# Patient Record
Sex: Male | Born: 1975 | Race: White | Hispanic: No | Smoking: Never smoker
Health system: Southern US, Community
[De-identification: ages and names within clinical notes are randomized; demographics above are authoritative.]

## PROBLEM LIST (undated history)

## (undated) DIAGNOSIS — K219 Gastro-esophageal reflux disease without esophagitis: Secondary | ICD-10-CM

## (undated) HISTORY — DX: Gastro-esophageal reflux disease without esophagitis: K21.9

---

## 2009-05-11 ENCOUNTER — Ambulatory Visit: Payer: Self-pay | Admitting: Family Medicine

## 2009-05-11 DIAGNOSIS — J309 Allergic rhinitis, unspecified: Secondary | ICD-10-CM | POA: Insufficient documentation

## 2009-05-11 DIAGNOSIS — F528 Other sexual dysfunction not due to a substance or known physiological condition: Secondary | ICD-10-CM | POA: Insufficient documentation

## 2009-11-09 ENCOUNTER — Ambulatory Visit: Payer: Self-pay | Admitting: Family Medicine

## 2009-11-09 DIAGNOSIS — L723 Sebaceous cyst: Secondary | ICD-10-CM | POA: Insufficient documentation

## 2009-11-16 ENCOUNTER — Ambulatory Visit: Payer: Self-pay | Admitting: Family Medicine

## 2009-11-16 DIAGNOSIS — R5383 Other fatigue: Secondary | ICD-10-CM | POA: Insufficient documentation

## 2009-11-16 DIAGNOSIS — R5381 Other malaise: Secondary | ICD-10-CM | POA: Insufficient documentation

## 2009-11-16 LAB — CONVERTED CEMR LAB
ALT: 27 units/L (ref 0–53)
AST: 34 units/L (ref 0–37)
Albumin: 3.7 g/dL (ref 3.5–5.2)
Alkaline Phosphatase: 63 units/L (ref 39–117)
Bilirubin, Direct: 0.2 mg/dL (ref 0.0–0.3)
Cholesterol: 166 mg/dL (ref 0–200)
HDL: 55.4 mg/dL (ref >39.00)
LDL Cholesterol: 98 mg/dL (ref 0–99)
Sex Hormone Binding: 27 nmol/L (ref 13–71)
Testosterone Free: 157.2 pg/mL (ref 47.0–244.0)
Testosterone-% Free: 2.4 % (ref 1.6–2.9)
Testosterone: 643.57 ng/dL (ref 350–890)
Total Bilirubin: 0.9 mg/dL (ref 0.3–1.2)
Total CHOL/HDL Ratio: 3
Total Protein: 6.5 g/dL (ref 6.0–8.3)
Triglycerides: 62 mg/dL (ref 0.0–149.0)
VLDL: 12.4 mg/dL (ref 0.0–40.0)

## 2009-11-21 LAB — CONVERTED CEMR LAB
BUN: 26 mg/dL — ABNORMAL HIGH (ref 6–23)
CO2: 24 meq/L (ref 19–32)
Calcium: 9 mg/dL (ref 8.4–10.5)
Chloride: 107 meq/L (ref 96–112)
Creatinine, Ser: 1.3 mg/dL (ref 0.4–1.5)
GFR calc non Af Amer: 67.93 mL/min (ref >60)
Glucose, Bld: 73 mg/dL (ref 70–99)
Potassium: 4.2 meq/L (ref 3.5–5.1)
Sodium: 141 meq/L (ref 135–145)

## 2010-06-21 ENCOUNTER — Telehealth: Payer: Self-pay | Admitting: Family Medicine

## 2010-10-25 ENCOUNTER — Ambulatory Visit: Payer: Self-pay | Admitting: Family Medicine

## 2010-10-25 DIAGNOSIS — N529 Male erectile dysfunction, unspecified: Secondary | ICD-10-CM | POA: Insufficient documentation

## 2010-10-25 LAB — CONVERTED CEMR LAB
Bacteria, UA: NONE SEEN
Casts: NONE SEEN /lpf
Crystals: NONE SEEN
Prolactin: 3.9 ng/mL (ref 2.1–17.1)
Sex Hormone Binding: 29 nmol/L (ref 13–71)
Squamous Epithelial / LPF: NONE SEEN /lpf
Testosterone Free: 60.1 pg/mL (ref 47.0–244.0)
Testosterone-% Free: 2.1 % (ref 1.6–2.9)
Testosterone: 285.26 ng/dL (ref 250–890)

## 2010-10-30 ENCOUNTER — Encounter (INDEPENDENT_AMBULATORY_CARE_PROVIDER_SITE_OTHER): Payer: Self-pay | Admitting: *Deleted

## 2010-10-30 LAB — CONVERTED CEMR LAB
BUN: 27 mg/dL — ABNORMAL HIGH (ref 6–23)
Basophils Absolute: 0 10*3/uL (ref 0.0–0.1)
Basophils Relative: 0.5 % (ref 0.0–3.0)
CO2: 32 meq/L (ref 19–32)
Calcium: 9.5 mg/dL (ref 8.4–10.5)
Chloride: 104 meq/L (ref 96–112)
Creatinine, Ser: 1.4 mg/dL (ref 0.4–1.5)
Eosinophils Absolute: 0.3 10*3/uL (ref 0.0–0.7)
Eosinophils Relative: 4.1 % (ref 0.0–5.0)
FSH: 3.7 milliintl units/mL (ref 1.4–18.1)
GFR calc non Af Amer: 63.57 mL/min (ref >60)
Glucose, Bld: 88 mg/dL (ref 70–99)
HCT: 45.6 % (ref 39.0–52.0)
Hemoglobin: 15.8 g/dL (ref 13.0–17.0)
LH: 2 milliintl units/mL (ref 1.50–9.30)
Lymphocytes Relative: 26.9 % (ref 12.0–46.0)
Lymphs Abs: 2.1 10*3/uL (ref 0.7–4.0)
MCHC: 34.7 g/dL (ref 30.0–36.0)
MCV: 89.7 fL (ref 78.0–100.0)
Monocytes Absolute: 0.6 10*3/uL (ref 0.1–1.0)
Monocytes Relative: 8.2 % (ref 3.0–12.0)
Neutro Abs: 4.7 10*3/uL (ref 1.4–7.7)
Neutrophils Relative %: 60.3 % (ref 43.0–77.0)
Platelets: 175 10*3/uL (ref 150.0–400.0)
Potassium: 4 meq/L (ref 3.5–5.1)
RBC: 5.08 M/uL (ref 4.22–5.81)
RDW: 13.4 % (ref 11.5–14.6)
Sodium: 141 meq/L (ref 135–145)
TSH: 1.12 microintl units/mL (ref 0.35–5.50)
WBC: 7.8 10*3/uL (ref 4.5–10.5)

## 2011-01-23 NOTE — Progress Notes (Signed)
Summary: mid chest pain  Phone Note Call from Patient   Caller: Patient Call For: Hannah Beat MD Summary of Call: Pt walked in complaining of mid chest pain since monday.  He says it's not all the time but if he turns a certain way he feels a sharp pain.  He works out at [employer] but didnt do anything, he thought, that would have pulled a muscle.  I told him he could have done something and not be aware of it.  No cardiac sxs.  Offered appt tomorrow but he said he would wait and see how he feels. Initial call taken by: Lowella Petties CMA,  June 21, 2010 9:15 AM  Follow-up for Phone Call        reasonable Follow-up by: Hannah Beat MD,  June 21, 2010 9:18 AM

## 2011-01-23 NOTE — Miscellaneous (Signed)
  Clinical Lists Changes  Medications: Added new medication of CIALIS 20 MG TABS (TADALAFIL) take 1/2 to 1 tablet 30 minute prior to intercourse - Signed Rx of CIALIS 20 MG TABS (TADALAFIL) take 1/2 to 1 tablet 30 minute prior to intercourse;  #10 x 11;  Signed;  Entered by: Benny Lennert CMA (AAMA);  Authorized by: Hannah Beat MD;  Method used: Electronically to [facility] [address], [place], [place], [place]  [address], Ph: [phone], Fax: [phone]    Prescriptions: CIALIS 20 MG TABS (TADALAFIL) take 1/2 to 1 tablet 30 minute prior to intercourse  #10 x 11   Entered by:   Benny Lennert CMA (AAMA)   Authorized by:   Hannah Beat MD   Signed by:   Benny Lennert CMA (AAMA) on 10/30/2010   Method used:   Electronically to        [facility] [address] (retail)       [address], [address]       [place], [place]  [address]       Ph: [phone]       Fax: [phone]   RxID:   [id number]   Prior Medications: ZYRTEC ALLERGY 10 MG TABS (CETIRIZINE HCL) once a day as needed for allergies Current Allergies: No known allergies

## 2011-01-23 NOTE — Assessment & Plan Note (Signed)
Summary: NEW PT TO EST/CLE   Vital Signs:  Patient profile:   35 year old male Height:      70 inches Weight:      172.0 pounds BMI:     24.77 Temp:     98.0 degrees F oral Pulse rate:   72 / minute Pulse rhythm:   regular BP sitting:   110 / 70  (left arm) Cuff size:   regular  Vitals Entered By: Benny Lennert CMA (May 11, 2009 9:16 AM)  History of Present Illness: Chief complaint new patient to be established   51 year old:  He and wife is having some tough times.  Quit drinking six months.   his primary concern today is discussion regarding problems with sexual function with his wife. At this point, he has been unable to achieve sexual intercourse over the last several months with his wife. They had serious marital problems within the last 6-9 months, but these have reportedly gotten better. They have seen a couple Veterinary surgeon. He has been able to achieve an erection in the morning. He also has been able to achieve an erection at other times, and he is able to masturbate orgasm. He has had previous times masturbated with significant frequency. There has been no infidelity, or concern for sexual transmitted infections.  Protein, creatine, aa, no anabolics. Never done cycles, GH.  SEX COUNSELOR  Preventive Screening-Counseling & Management     Smoking Status: never     Does Patient Exercise: yes      Drug Use:  no.    Allergies (verified): No Known Drug Allergies  Past History:  Past Medical History:    Allergic rhinitis  Past Surgical History:    no  Family History:    Family History of Alcoholism/Addiction  Social History:    Marital Status: Married    Children:     Occupation: works at [employer]    Former Heavy ETOH, now rare    Never Smoked    Drug use-no    Regular exercise-yes    Likes to work out a lot.    Smoking Status:  never    Drug Use:  no    Does Patient Exercise:  yes  Review of Systems General:  Denies chills, fatigue, and  fever. GU:  Complains of decreased libido and erectile dysfunction; denies discharge, dysuria, genital sores, hematuria, incontinence, nocturia, urinary frequency, and urinary hesitancy.  Physical Exam  General:  GEN: Well-developed,well-nourished,in no acute distress; alert,appropriate and cooperative throughout examination HEENT: Normocephalic and atraumatic without obvious abnormalities. No apparent alopecia or balding. Ears, externally no deformities PULM: Breathing comfortably in no respiratory distress EXT: No clubbing, cyanosis, or edema PSYCH: Normally interactive. Cooperative during the interview. Pleasant. Friendly and conversant. Not anxious or depressed appearing. Normal, full affect.    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 I recommended primarily, and that he and his wife seek sexual counseling, and made this referral. It does not sound me like his history is consistent with organic impotence.  Orders: Psychology Referral (Psychology)  Complete Medication List: 1)  Zyrtec Allergy 10 Mg Tabs (Cetirizine hcl) .Marland KitchenMarland KitchenMarland Kitchen  Once a day as needed for allergies    Current Allergies (reviewed today): No known allergies

## 2011-01-23 NOTE — Assessment & Plan Note (Signed)
Summary: HAS QUESTIONS / LFW   Vital Signs:  Patient profile:   35 year old male Height:      70 inches Weight:      188 pounds BMI:     27.07 Temp:     99.2 degrees F oral Pulse rate:   76 / minute Pulse rhythm:   regular BP sitting:   110 / 70  (left arm) Cuff size:   regular  Vitals Entered By: Linde Gillis CMA Duncan Dull) (October 25, 2010 12:37 PM) CC: discuss things with Dr. Patsy Lager   History of Present Illness: 35 year old male:  Worried about erection issues.  Has not been able to have sex for a year.     Allergies (verified): No Known Drug Allergies  Physical Exam  General:  Well-developed,well-nourished,in no acute distress; alert,appropriate and cooperative throughout examination Head:  Normocephalic and atraumatic without obvious abnormalities. No apparent alopecia or balding. Ears:  no external deformities.   Nose:  no external deformity.   Abdomen:  Bowel sounds positive,abdomen soft and non-tender without masses, organomegaly or hernias noted. Genitalia:  Testes bilaterally descended without nodularity, tenderness or masses. No scrotal masses or lesions. No penis lesions or urethral discharge.   Impression & Recommendations:  Problem # 1:  ORGANIC IMPOTENCE (ICD-607.84) >25 minutes spent in face to face time with patient, >50% spent in counselling or coordination of care: complex conversation with the patient. 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 The patient did significant and walk cycle the summer, proximally 4 months ago. Also to continue 3 at that time. Currently he is taking a product called "manimal" and some nitric oxide boosters. He has concerns that either from these things or some other organic causes the point of his erectile difficulties. He has not had sexual intercourse with his wife for many months. These are all very valid concerns, he could have had a feedback mechanism into his hypothalamic pituitary axis. I will check for  this functionality. I think most recently, a trial of some Viagra or Cialis after this workup is most appropriate  Orders: Venipuncture (24401) TLB-BMP (Basic Metabolic Panel-BMET) (80048-METABOL) TLB-TSH (Thyroid Stimulating Hormone) (84443-TSH) T- * Misc. Laboratory test [phone]) TLB-FSH (Follicle Stimulating Hormone) (83001-FSH) TLB-Luteinizing Hormone [facility]) (83002-LH) T-Prolactin [phone]) TLB-CBC Platelet - w/Differential (85025-CBCD) T-Urine Microscopic ([id number])  Problem # 2:  FATIGUE (ICD-780.79)  Orders: Venipuncture (43329) TLB-BMP (Basic Metabolic Panel-BMET) (80048-METABOL) TLB-TSH (Thyroid Stimulating Hormone) (84443-TSH) T- * Misc. Laboratory test [phone]) TLB-FSH (Follicle Stimulating Hormone) (83001-FSH) TLB-Luteinizing Hormone (LH) (83002-LH) T-Prolactin [phone]) TLB-CBC Platelet - w/Differential (85025-CBCD) T-Urine Microscopic (10932-35573)  Complete Medication List: 1)  Zyrtec Allergy 10 Mg Tabs (Cetirizine hcl) .... Once a day as needed for allergies   Orders Added: 1)  Venipuncture [36415] 2)  TLB-BMP (Basic Metabolic Panel-BMET) [80048-METABOL] 3)  TLB-TSH (Thyroid Stimulating Hormone) [84443-TSH] 4)  T- * Misc. Laboratory test [99999] 5)  TLB-FSH (Follicle Stimulating Hormone) [83001-FSH] 6)  TLB-Luteinizing Hormone (LH) [83002-LH] 7)  T-Prolactin [22025-42706] 8)  TLB-CBC Platelet - w/Differential [85025-CBCD] 9)  T-Urine Microscopic [23762-83151] 10)  Est. Patient Level IV [76160]    Current Allergies (reviewed today): No known allergies

## 2011-11-25 ENCOUNTER — Other Ambulatory Visit: Payer: Self-pay | Admitting: Family Medicine

## 2013-01-16 ENCOUNTER — Ambulatory Visit (INDEPENDENT_AMBULATORY_CARE_PROVIDER_SITE_OTHER): Payer: BC Managed Care – PPO | Admitting: Internal Medicine

## 2013-01-16 ENCOUNTER — Encounter: Payer: Self-pay | Admitting: Internal Medicine

## 2013-01-16 ENCOUNTER — Telehealth: Payer: Self-pay | Admitting: Family Medicine

## 2013-01-16 VITALS — BP 122/80 | HR 96 | Temp 98.6°F | Wt 197.0 lb

## 2013-01-16 DIAGNOSIS — K219 Gastro-esophageal reflux disease without esophagitis: Secondary | ICD-10-CM

## 2013-01-16 DIAGNOSIS — K1379 Other lesions of oral mucosa: Secondary | ICD-10-CM

## 2013-01-16 DIAGNOSIS — K1370 Unspecified lesions of oral mucosa: Secondary | ICD-10-CM

## 2013-01-16 MED ORDER — PREDNISONE 20 MG PO TABS: 40.0000 mg | ORAL_TABLET | Freq: Every day | ORAL | Status: DC

## 2013-01-16 NOTE — Telephone Encounter (Signed)
Patient Information:  Caller Name: Rip  Phone: [phone]  Patient: Howard Weeks, Howard Weeks  Gender: Male  DOB: 19-Sep-1976  Age: 37 Years  PCP: Hannah Beat (Family Practice)  Office Follow Up:  Does the office need to follow up with this patient?: No  Instructions For The Office: N/A   Symptoms  Reason For Call & Symptoms: Patient states she feels like something in his throat.  Reviewed Health History In EMR: Yes  Reviewed Medications In EMR: Yes  Reviewed Allergies In EMR: Yes  Reviewed Surgeries / Procedures: Yes  Date of Onset of Symptoms: 01/16/2013  Guideline(s) Used:  Sore Throat  Disposition Per Guideline:   See Today in Office  Reason For Disposition Reached:   Severe sore throat pain  Advice Given:  N/A  Appointment Scheduled:  01/16/2013 16:00:00 Appointment Scheduled Provider:  Tillman Abide [facility])

## 2013-01-16 NOTE — Patient Instructions (Signed)
Please start over the counter omeprazole 20mg  daily on an empty stomach. You can decrease this to every other day after 2-4 weeks if your heartburn is quiet. You can further decrease it or use it only as needed if things really quiet down.   Gastroesophageal Reflux Disease, Adult Gastroesophageal reflux disease (GERD) happens when acid from your stomach flows up into the esophagus. When acid comes in contact with the esophagus, the acid causes soreness (inflammation) in the esophagus. Over time, GERD may create small holes (ulcers) in the lining of the esophagus. CAUSES   Increased body weight. This puts pressure on the stomach, making acid rise from the stomach into the esophagus.  Smoking. This increases acid production in the stomach.  Drinking alcohol. This causes decreased pressure in the lower esophageal sphincter (valve or ring of muscle between the esophagus and stomach), allowing acid from the stomach into the esophagus.  Late evening meals and a full stomach. This increases pressure and acid production in the stomach.  A malformed lower esophageal sphincter. Sometimes, no cause is found. SYMPTOMS   Burning pain in the lower part of the mid-chest behind the breastbone and in the mid-stomach area. This may occur twice a week or more often.  Trouble swallowing.  Sore throat.  Dry cough.  Asthma-like symptoms including chest tightness, shortness of breath, or wheezing. DIAGNOSIS  Your caregiver may be able to diagnose GERD based on your symptoms. In some cases, X-rays and other tests may be done to check for complications or to check the condition of your stomach and esophagus. TREATMENT  Your caregiver may recommend over-the-counter or prescription medicines to help decrease acid production. Ask your caregiver before starting or adding any new medicines.  HOME CARE INSTRUCTIONS   Change the factors that you can control. Ask your caregiver for guidance concerning weight loss,  quitting smoking, and alcohol consumption.  Avoid foods and drinks that make your symptoms worse, such as:  Caffeine or alcoholic drinks.  Chocolate.  Peppermint or mint flavorings.  Garlic and onions.  Spicy foods.  Citrus fruits, such as oranges, lemons, or limes.  Tomato-based foods such as sauce, chili, salsa, and pizza.  Fried and fatty foods.  Avoid lying down for the 3 hours prior to your bedtime or prior to taking a nap.  Eat small, frequent meals instead of large meals.  Wear loose-fitting clothing. Do not wear anything tight around your waist that causes pressure on your stomach.  Raise the head of your bed 6 to 8 inches with wood blocks to help you sleep. Extra pillows will not help.  Only take over-the-counter or prescription medicines for pain, discomfort, or fever as directed by your caregiver.  Do not take aspirin, ibuprofen, or other nonsteroidal anti-inflammatory drugs (NSAIDs). SEEK IMMEDIATE MEDICAL CARE IF:   You have pain in your arms, neck, jaw, teeth, or back.  Your pain increases or changes in intensity or duration.  You develop nausea, vomiting, or sweating (diaphoresis).  You develop shortness of breath, or you faint.  Your vomit is green, yellow, black, or looks like coffee grounds or blood.  Your stool is red, bloody, or black. These symptoms could be signs of other problems, such as heart disease, gastric bleeding, or esophageal bleeding. MAKE SURE YOU:   Understand these instructions.  Will watch your condition.  Will get help right away if you are not doing well or get worse. Document Released: 09/19/2005 Document Revised: 03/03/2012 Document Reviewed: 06/29/2011 [facility] Patient Information 2013 [employer].  

## 2013-01-16 NOTE — Assessment & Plan Note (Signed)
Doesn't appear to be infectious I suspect from acid reflux  Will treat with prednisone to reduce the swelling Recommended omeprazole regularly for a while to settle down apparent reflux Decrease caffeine Don't eat to bedtime Usually 2 drinks per day--will watch

## 2013-01-16 NOTE — Assessment & Plan Note (Signed)
Has clear and increasing symptoms Instructions given

## 2013-01-16 NOTE — Telephone Encounter (Signed)
See OV note.  

## 2013-01-16 NOTE — Progress Notes (Signed)
  Subjective:    Patient ID: Howard Weeks, male    DOB: 1976-07-20, 37 y.o.   MRN: [id number]  HPI Awoke this morning and feels like there is something partially blocking his throat Able to swallow Has some trouble talking  Non smoker Not sick No fever  Feels distinct swelling Can actually move it side to side (top of back of throat)  No current outpatient prescriptions on file prior to visit.    No Known Allergies  No past medical history on file.  No past surgical history on file.  No family history on file.  History   Social History  . Marital Status: Divorced    Spouse Name: N/A    Number of Children: 0  . Years of Education: N/A   Occupational History  . [employer]    Social History Main Topics  . Smoking status: Never Smoker   . Smokeless tobacco: Never Used  . Alcohol Use: No  . Drug Use: No  . Sexually Active: Not on file   Other Topics Concern  . Not on file   Social History Narrative  . No narrative on file   Review of Systems Some heartburn---near daily recently No regular cough No allergies or PND     Objective:   Physical Exam  Constitutional: He appears well-developed and well-nourished. No distress.  HENT:  Nose: Nose normal.       Swollen red uvula --sits on top of tongue. Some injection of soft palate No sig tonsillar enlargement or injection   Neck: Normal range of motion.  Lymphadenopathy:    He has no cervical adenopathy.          Assessment & Plan:

## 2013-02-27 ENCOUNTER — Other Ambulatory Visit: Payer: Self-pay | Admitting: Family Medicine

## 2013-02-28 NOTE — Telephone Encounter (Signed)
OK to refill? You have not seen patient since 2011.

## 2013-03-01 NOTE — Telephone Encounter (Signed)
i refilled, but recommend f/u for CPX this spring or summer

## 2013-08-17 ENCOUNTER — Ambulatory Visit (INDEPENDENT_AMBULATORY_CARE_PROVIDER_SITE_OTHER): Payer: BC Managed Care – PPO | Admitting: Family Medicine

## 2013-08-17 ENCOUNTER — Encounter: Payer: Self-pay | Admitting: Family Medicine

## 2013-08-17 VITALS — BP 106/68 | HR 91 | Temp 98.7°F | Ht 70.0 in | Wt 190.2 lb

## 2013-08-17 DIAGNOSIS — M546 Pain in thoracic spine: Secondary | ICD-10-CM

## 2013-08-17 DIAGNOSIS — M549 Dorsalgia, unspecified: Secondary | ICD-10-CM | POA: Insufficient documentation

## 2013-08-17 MED ORDER — CYCLOBENZAPRINE HCL 10 MG PO TABS: 10.0000 mg | ORAL_TABLET | Freq: Every evening | ORAL | Status: DC | PRN

## 2013-08-17 NOTE — Assessment & Plan Note (Signed)
Suspect rhomboid /trap spasm  Recommend heat/ stretches/ massage adn ref to PT eval and tx  Flexeril given for nit time use nsaid for day  Adv against heavy lifting for now  Also cervical support pillow

## 2013-08-17 NOTE — Progress Notes (Signed)
Subjective:    Patient ID: Howard Weeks, male    DOB: 15-Nov-1976, 37 y.o.   MRN: [id number]  HPI Here with shoulder and elbow pain  Started 2 weeks ago after sleeping on a bad mattress (then worked out- but pain started before that) He thinks he may have a pinched nerve Pain is positional- twisting makes a difference and also turning head to L   Pain started medial to left shoulder pain  Then rad down to deltoid area of arm  Pain is not severe- more persistent and annoying (when not distracted)  Difficult to sleep now  Sleeps on 2 normal size pillows-nothing special   No numbness/ weakness or problems with grip  Thinks he has had this before from working out - much more brief   Massage eased it  Working out did not help it  Takes 600 to 800 mg of ibuprofen occ otc - helps some   Patient Active Problem List   Diagnosis Date Noted  . Uvular edema 01/16/2013  . GERD (gastroesophageal reflux disease)   . 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 Other malaise and fatigue 11/16/2009  . SEBACEOUS CYST 11/09/2009  . 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 ALLERGIC RHINITIS 05/11/2009   Past Medical History  Diagnosis Date  . GERD (gastroesophageal reflux disease)    No past surgical history on file. History  Substance Use Topics  . Smoking status: Never Smoker   . Smokeless tobacco: Never Used  . Alcohol Use: Yes     Comment: socially   No family history on file. No Known Allergies Current Outpatient Prescriptions on File Prior to Visit  Medication Sig Dispense Refill  . CIALIS 20 MG tablet TAKE ONE-HALF TO ONE TABLET 30 MINUTES PRIOR TO INTERCOURSE  10 tablet  5   No current facility-administered medications on file prior to visit.       Review of Systems Review of Systems  Constitutional: Negative for fever, appetite change, fatigue and unexpected weight change.  Eyes: Negative for pain and visual disturbance.  Respiratory: Negative for cough and shortness of breath.    Cardiovascular: Negative for cp or palpitations    Gastrointestinal: Negative for nausea, diarrhea and constipation.  Genitourinary: Negative for urgency and frequency.  Skin: Negative for pallor or rash  MSK neg for joint swelling or redness / neg for low back pain   Neurological: Negative for weakness, light-headedness, numbness and headaches.  Hematological: Negative for adenopathy. Does not bruise/bleed easily.  Psychiatric/Behavioral: Negative for dysphoric mood. The patient is not nervous/anxious.         Objective:   Physical Exam  Constitutional: He appears well-developed and well-nourished. No distress.  Well appearing muscular build   HENT:  Head: Normocephalic and atraumatic.  Mouth/Throat: Oropharynx is clear and moist.  Eyes: Conjunctivae and EOM are normal. Pupils are equal, round, and reactive to light. No scleral icterus.  Neck: Normal range of motion. Neck supple.  Nl rom neck  L rotation worsens his back pain   Cardiovascular: Normal rate and regular rhythm.   Pulmonary/Chest: Effort normal and breath sounds normal.  Musculoskeletal: Normal range of motion. He exhibits tenderness. He exhibits no edema.       Thoracic back: He exhibits tenderness and spasm. He exhibits normal range of motion, no bony tenderness, no swelling, no edema and no deformity.  Tender medial to L scapula with palpable spasm  No redness or joint changes  Nl rom shoulder and arm  Neg hawking/ neer tests  No arm tenderness  Lymphadenopathy:    He has no cervical adenopathy.  Neurological: He is alert. He has normal reflexes. He displays no atrophy and no tremor. No cranial nerve deficit or sensory deficit. He exhibits normal muscle tone. Coordination normal.  Skin: Skin is warm and dry. No rash noted. No erythema.  Psychiatric: He has a normal mood and affect.          Assessment & Plan:

## 2013-08-17 NOTE — Patient Instructions (Addendum)
Continue heat or ice or both and massage  Ibuprofen or aleve with food  Flexeril at night as needed Stop up front for Physical therapy referral

## 2014-02-26 ENCOUNTER — Telehealth: Payer: Self-pay

## 2014-02-26 ENCOUNTER — Emergency Department: Payer: Self-pay | Admitting: Emergency Medicine

## 2014-02-26 DIAGNOSIS — K219 Gastro-esophageal reflux disease without esophagitis: Secondary | ICD-10-CM

## 2014-02-26 LAB — COMPREHENSIVE METABOLIC PANEL
Albumin: 3.8 g/dL (ref 3.4–5.0)
Alkaline Phosphatase: 73 U/L
Anion Gap: 8 (ref 7–16)
BUN: 17 mg/dL (ref 7–18)
Bilirubin,Total: 0.3 mg/dL (ref 0.2–1.0)
Calcium, Total: 8.3 mg/dL — ABNORMAL LOW (ref 8.5–10.1)
Chloride: 106 mmol/L (ref 98–107)
Co2: 25 mmol/L (ref 21–32)
Creatinine: 1.18 mg/dL (ref 0.60–1.30)
EGFR (African American): >60
EGFR (Non-African Amer.): >60
Glucose: 121 mg/dL — ABNORMAL HIGH (ref 65–99)
Osmolality: 280 (ref 275–301)
Potassium: 3.5 mmol/L (ref 3.5–5.1)
SGOT(AST): 37 U/L (ref 15–37)
SGPT (ALT): 57 U/L (ref 12–78)
Sodium: 139 mmol/L (ref 136–145)
Total Protein: 7.7 g/dL (ref 6.4–8.2)

## 2014-02-26 LAB — URINALYSIS, COMPLETE
Bacteria: NONE SEEN
Bilirubin,UR: NEGATIVE
Blood: NEGATIVE
Glucose,UR: NEGATIVE mg/dL (ref 0–75)
Ketone: NEGATIVE
Leukocyte Esterase: NEGATIVE
Nitrite: NEGATIVE
Ph: 6 (ref 4.5–8.0)
Protein: NEGATIVE
RBC,UR: <1 /HPF (ref 0–5)
Specific Gravity: 1.02 (ref 1.003–1.030)
Squamous Epithelial: NONE SEEN
WBC UR: 1 /HPF (ref 0–5)

## 2014-02-26 LAB — CBC WITH DIFFERENTIAL/PLATELET
Basophil #: 0.1 10*3/uL (ref 0.0–0.1)
Basophil %: 0.6 %
Eosinophil #: 0.5 10*3/uL (ref 0.0–0.7)
Eosinophil %: 5.2 %
HCT: 47.2 % (ref 40.0–52.0)
HGB: 16 g/dL (ref 13.0–18.0)
Lymphocyte #: 2.6 10*3/uL (ref 1.0–3.6)
Lymphocyte %: 29.6 %
MCH: 32 pg (ref 26.0–34.0)
MCHC: 34 g/dL (ref 32.0–36.0)
MCV: 94 fL (ref 80–100)
Monocyte #: 0.7 x10 3/mm (ref 0.2–1.0)
Monocyte %: 7.6 %
Neutrophil #: 5 10*3/uL (ref 1.4–6.5)
Neutrophil %: 57 %
Platelet: 186 10*3/uL (ref 150–440)
RBC: 5.02 10*6/uL (ref 4.40–5.90)
RDW: 13.3 % (ref 11.5–14.5)
WBC: 8.8 10*3/uL (ref 3.8–10.6)

## 2014-02-26 LAB — LIPASE, BLOOD: Lipase: 420 U/L — ABNORMAL HIGH (ref 73–393)

## 2014-02-26 NOTE — Telephone Encounter (Signed)
Pt left v/m; pt said has been seen at our office for acid reflux; pt has taken medication and no relief; pt [facility].Please advise.

## 2015-04-11 ENCOUNTER — Other Ambulatory Visit: Payer: Self-pay | Admitting: Family Medicine

## 2017-01-31 ENCOUNTER — Other Ambulatory Visit: Payer: Self-pay | Admitting: Emergency Medicine

## 2017-01-31 ENCOUNTER — Ambulatory Visit
Admission: RE | Admit: 2017-01-31 | Discharge: 2017-01-31 | Disposition: A | Discharge status: Home / Self Care | Payer: BLUE CROSS/BLUE SHIELD | Source: Ambulatory Visit | Attending: Emergency Medicine | Admitting: Emergency Medicine

## 2017-01-31 DIAGNOSIS — M79A21 Nontraumatic compartment syndrome of right lower extremity: Secondary | ICD-10-CM | POA: Insufficient documentation

## 2017-01-31 DIAGNOSIS — M79661 Pain in right lower leg: Secondary | ICD-10-CM | POA: Insufficient documentation

## 2017-01-31 DIAGNOSIS — M79A9 Nontraumatic compartment syndrome of other sites: Secondary | ICD-10-CM

## 2017-01-31 DIAGNOSIS — M7989 Other specified soft tissue disorders: Secondary | ICD-10-CM

## 2019-12-31 ENCOUNTER — Ambulatory Visit: Payer: BC Managed Care – PPO | Attending: Internal Medicine

## 2019-12-31 DIAGNOSIS — Z20822 Contact with and (suspected) exposure to covid-19: Secondary | ICD-10-CM

## 2020-01-02 ENCOUNTER — Telehealth: Payer: Self-pay | Admitting: Adult Health

## 2020-01-02 LAB — NOVEL CORONAVIRUS, NAA: SARS-CoV-2, NAA: DETECTED — AB

## 2020-01-02 NOTE — Telephone Encounter (Signed)
Patient called about Positive Covid test.  2 patient identifiers confirmed.  Date Tested: 12/31/2019  Date of Symptom onset: 12/24/2019   Symptoms: mild     Isolation Recommendations:  Patient understands the needs to stay in isolation for a total of 10 days from onset of symptom or 14 days total from date of testing if no symptom. Reviewed Masking.    Supportive Care Recommendations: Encouraged plenty of fluid intake, Tylenol per package directions, and to remain as active as possible.    Patient knows the health department may be in touch.    I answered all of patient's questions and all concerns addressed.  Gave information on My chart.    Time Spent: 4 minutes  Lillard Anes, NP

## 2020-01-04 ENCOUNTER — Ambulatory Visit: Payer: BC Managed Care – PPO | Attending: Internal Medicine

## 2020-01-24 ENCOUNTER — Emergency Department: Payer: BC Managed Care – PPO

## 2020-01-24 ENCOUNTER — Emergency Department
Admission: EM | Admit: 2020-01-24 | Discharge: 2020-01-24 | Disposition: A | Discharge status: Home / Self Care | Payer: BC Managed Care – PPO | Attending: Emergency Medicine | Admitting: Emergency Medicine

## 2020-01-24 ENCOUNTER — Other Ambulatory Visit: Payer: Self-pay

## 2020-01-24 ENCOUNTER — Encounter: Payer: Self-pay | Admitting: Emergency Medicine

## 2020-01-24 DIAGNOSIS — Z79899 Other long term (current) drug therapy: Secondary | ICD-10-CM | POA: Insufficient documentation

## 2020-01-24 DIAGNOSIS — R0602 Shortness of breath: Secondary | ICD-10-CM

## 2020-01-24 LAB — BASIC METABOLIC PANEL
Anion gap: 13 (ref 5–15)
BUN: 12 mg/dL (ref 6–20)
CO2: 25 mmol/L (ref 22–32)
Calcium: 9.3 mg/dL (ref 8.9–10.3)
Chloride: 103 mmol/L (ref 98–111)
Creatinine, Ser: 1.22 mg/dL (ref 0.61–1.24)
GFR calc Af Amer: >60 mL/min (ref >60)
GFR calc non Af Amer: >60 mL/min (ref >60)
Glucose, Bld: 164 mg/dL — ABNORMAL HIGH (ref 70–99)
Potassium: 4 mmol/L (ref 3.5–5.1)
Sodium: 141 mmol/L (ref 135–145)

## 2020-01-24 LAB — CBC
HCT: 42.7 % (ref 39.0–52.0)
Hemoglobin: 14.5 g/dL (ref 13.0–17.0)
MCH: 30.8 pg (ref 26.0–34.0)
MCHC: 34 g/dL (ref 30.0–36.0)
MCV: 90.7 fL (ref 80.0–100.0)
Platelets: 205 10*3/uL (ref 150–400)
RBC: 4.71 MIL/uL (ref 4.22–5.81)
RDW: 12.8 % (ref 11.5–15.5)
WBC: 8.7 10*3/uL (ref 4.0–10.5)
nRBC: 0 % (ref 0.0–0.2)

## 2020-01-24 LAB — TROPONIN I (HIGH SENSITIVITY): Troponin I (High Sensitivity): <2 ng/L (ref <18)

## 2020-01-24 MED ORDER — PREDNISONE 50 MG PO TABS: 50.0000 mg | ORAL_TABLET | Freq: Every day | ORAL | 0 refills | Status: DC

## 2020-01-24 NOTE — ED Triage Notes (Signed)
Pt here for [facility], mostly with exertion. Pt reports unable to walk up 5 steps without getting SHOB. No cough. Dx with covid 12/31/19 but has been back to work and cleared.  C/o something feeling inflamed/stuck in throat/roof of mouth.  Tight feeling in chest when has SHOB r/t exertion.  Unlabored currently. No SHOB when sitting still.

## 2020-01-24 NOTE — ED Provider Notes (Signed)
[facility] Emergency Department Provider Note   ____________________________________________    I have reviewed the triage vital signs and the nursing notes.   HISTORY  Chief Complaint Shortness of Breath     HPI Howard Weeks is a 44 y.o. male who presents with complaints of shortness of breath, now improved.  Patient reports since having Covid earlier in the month he has had shortness of breath especially notable with exertion.  Denies chest pain.  No pleurisy.  No cough fevers.  No nausea vomiting.  No calf pain or swelling.  No history of blood clots.  Patient reports this morning he had a episode of significant shortness of breath after exerting himself during intercourse.  Currently feels well and at his baseline.  Past Medical History:  Diagnosis Date  . GERD (gastroesophageal reflux disease)     Patient Active Problem List   Diagnosis Date Noted  . Upper back pain on left side 08/17/2013  . Uvular edema 01/16/2013  . GERD (gastroesophageal reflux disease)   . 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 Other malaise and fatigue 11/16/2009  . SEBACEOUS CYST 11/09/2009  . 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 ALLERGIC RHINITIS 05/11/2009    History reviewed. No pertinent surgical history.  Prior to Admission medications   Medication Sig Start Date End Date Taking? Authorizing Provider  CIALIS 20 MG tablet TAKE ONE-HALF TO ONE TABLET 30 MINUTES PRIOR TO INTERCOURSE 02/27/13   Copland, Spencer, MD  cyclobenzaprine (FLEXERIL) 10 MG tablet Take 1 tablet (10 mg total) by mouth at bedtime as needed for muscle spasms. 08/17/13   Tower, Wynelle Fanny, MD  esomeprazole (NEXIUM) 20 MG capsule Take 20 mg by mouth daily before breakfast.    [provider]  predniSONE (DELTASONE) 50 MG tablet Take 1 tablet (50 mg total) by mouth daily with breakfast. 01/24/20   Lavonia Drafts, MD     Allergies Patient has no known allergies.  History reviewed. No pertinent  family history.  Social History Social History   Tobacco Use  . Smoking status: Never Smoker  . Smokeless tobacco: Never Used  Substance Use Topics  . Alcohol use: Yes    Comment: socially  . Drug use: No    Review of Systems  Constitutional: No fever/chills Eyes: No visual changes.  ENT: Inflammation to the roof of mouth Cardiovascular: Denies chest pain. Respiratory: As above. Gastrointestinal: No abdominal pain.    Genitourinary: Negative for dysuria. Musculoskeletal: Negative for back pain. Skin: Negative for rash. Neurological: Negative for headaches   ____________________________________________   PHYSICAL EXAM:  VITAL SIGNS: ED Triage Vitals  Enc Vitals Group     BP 01/24/20 1422 (!) 152/102     Pulse Rate 01/24/20 1422 (!) 108     Resp 01/24/20 1422 20     Temp 01/24/20 1422 98.4 F (36.9 C)     Temp Source 01/24/20 1422 Oral     SpO2 01/24/20 1422 98 %     Weight 01/24/20 1423 86.2 kg (190 lb)     Height 01/24/20 1423 1.778 m (5\' 10" )     Head Circumference --      Peak Flow --      Pain Score 01/24/20 1422 0     Pain Loc --      Pain Edu? --      Excl. in [place]? --     Constitutional: Alert and oriented.  Eyes: Conjunctivae are normal.  Head: Atraumatic. Nose: No congestion/rhinnorhea. Mouth/Throat: Mucous  membranes are moist.  Small area of irritation/inflammation to the roof of the mouth, no throat swelling, normal uvula Neck:  Painless ROM Cardiovascular: Normal rate, regular rhythm. Grossly normal heart sounds.  Good peripheral circulation. Respiratory: Normal respiratory effort.  No retractions. Lungs CTAB.  Musculoskeletal: No lower extremity tenderness nor edema.  Warm and well perfused Neurologic:  Normal speech and language. No gross focal neurologic deficits are appreciated.  Skin:  Skin is warm, dry and intact. No rash noted. Psychiatric: Mood and affect are normal. Speech and behavior are  normal.  ____________________________________________   LABS (all labs ordered are listed, but only abnormal results are displayed)  Labs Reviewed  BASIC METABOLIC PANEL - Abnormal; Notable for the following components:      Result Value   Glucose, Bld 164 (*)    All other components within normal limits  CBC  TROPONIN I (HIGH SENSITIVITY)   ____________________________________________  EKG  ED ECG REPORT I, Jene Every, the attending physician, personally viewed and interpreted this ECG.  Date: 01/24/2020  Rhythm: normal sinus rhythm QRS Axis: normal Intervals: normal ST/T Wave abnormalities: normal Narrative Interpretation: no evidence of acute ischemia  ____________________________________________  RADIOLOGY  Chest x-ray unremarkable ____________________________________________   PROCEDURES  Procedure(s) performed: No  Procedures   Critical Care performed: No ____________________________________________   INITIAL IMPRESSION / ASSESSMENT AND PLAN / ED COURSE  Pertinent labs & imaging results that were available during my care of the patient were reviewed by me and considered in my medical decision making (see chart for details).  Patient well-appearing in no acute distress, normal respirations on my exam, normal heart rate.  Very reassuring exam and he notes that he does feel at baseline.  I suspect this is related to post Covid shortness of breath given his description that it is been occurring for several weeks now.  Chest x-ray is unremarkable.  Will trial prednisone burst x5 days to see if this helps with his breathing, he will require close outpatient follow-up.  Return precautions discussed   ____________________________________________   FINAL CLINICAL IMPRESSION(S) / ED DIAGNOSES  Final diagnoses:  Shortness of breath        Note:  This document was prepared using Dragon voice recognition software and may include unintentional dictation  errors.   Jene Every, MD 01/24/20 1734

## 2020-01-24 NOTE — ED Notes (Signed)
NAD noted at time of D/C. Pt denies questions or concerns. Pt ambulatory to the lobby at this time. Unable to obtain E-sig due to patient being in the hallway.

## 2020-12-29 ENCOUNTER — Encounter: Payer: Self-pay | Admitting: Family Medicine

## 2020-12-29 ENCOUNTER — Ambulatory Visit: Payer: BC Managed Care – PPO | Admitting: Family Medicine

## 2020-12-29 ENCOUNTER — Ambulatory Visit (INDEPENDENT_AMBULATORY_CARE_PROVIDER_SITE_OTHER): Payer: BC Managed Care – PPO | Admitting: Family Medicine

## 2020-12-29 ENCOUNTER — Other Ambulatory Visit: Payer: Self-pay

## 2020-12-29 VITALS — BP 125/82 | HR 83 | Ht 70.0 in | Wt 194.8 lb

## 2020-12-29 DIAGNOSIS — Z23 Encounter for immunization: Secondary | ICD-10-CM

## 2020-12-29 DIAGNOSIS — Z0000 Encounter for general adult medical examination without abnormal findings: Secondary | ICD-10-CM | POA: Diagnosis not present

## 2020-12-29 DIAGNOSIS — K219 Gastro-esophageal reflux disease without esophagitis: Secondary | ICD-10-CM | POA: Diagnosis not present

## 2020-12-29 DIAGNOSIS — R0681 Apnea, not elsewhere classified: Secondary | ICD-10-CM | POA: Diagnosis not present

## 2020-12-29 DIAGNOSIS — N529 Male erectile dysfunction, unspecified: Secondary | ICD-10-CM | POA: Diagnosis not present

## 2020-12-29 MED ORDER — TADALAFIL 20 MG PO TABS: 20.0000 mg | ORAL_TABLET | Freq: Every day | ORAL | 5 refills | Status: AC | PRN

## 2020-12-29 NOTE — Progress Notes (Signed)
Established Patient Office Visit  SUBJECTIVE:  Subjective  Patient ID: Howard Weeks, male    DOB: 06/02/1976  Age: 45 y.o. MRN: [id number]  CC:  Chief Complaint  Patient presents with  . New Patient (Initial Visit)    Patient is here to reestablish care with this office. He was a patient several years ago and needs to reestablish care and have a physical for this year.    HPI Howard Weeks is a 45 y.o. male presenting today for     Past Medical History:  Diagnosis Date  . GERD (gastroesophageal reflux disease)     History reviewed. No pertinent surgical history.  History reviewed. No pertinent family history.  Social History   Socioeconomic History  . Marital status: Divorced    Spouse name: Not on file  . Number of children: 0  . Years of education: Not on file  . Highest education level: Not on file  Occupational History  . Occupation: Leisure centre manager  Tobacco Use  . Smoking status: Never Smoker  . Smokeless tobacco: Never Used  Substance and Sexual Activity  . Alcohol use: Yes    Comment: socially  . Drug use: No  . Sexual activity: Not on file  Other Topics Concern  . Not on file  Social History Narrative  . Not on file   Social Determinants of Health   Financial Resource Strain: Not on file  Food Insecurity: Not on file  Transportation Needs: Not on file  Physical Activity: Not on file  Stress: Not on file  Social Connections: Not on file  Intimate Partner Violence: Not on file     Current Outpatient Medications:  .  esomeprazole (NEXIUM) 20 MG capsule, Take 20 mg by mouth daily before breakfast., Disp: , Rfl:  .  tadalafil (CIALIS) 20 MG tablet, Take 1 tablet (20 mg total) by mouth daily as needed for erectile dysfunction., Disp: 10 tablet, Rfl: 5   No Known Allergies  ROS Review of Systems  Constitutional: Negative.   HENT: Negative.   Respiratory: Negative.   Cardiovascular: Negative.   Endocrine: Negative.   Musculoskeletal: Negative.    Neurological: Negative.   Hematological: Negative.   Psychiatric/Behavioral: Negative.      OBJECTIVE:    Physical Exam Constitutional:      Appearance: Normal appearance.  HENT:     Right Ear: Tympanic membrane normal.     Mouth/Throat:     Mouth: Mucous membranes are moist.  Cardiovascular:     Rate and Rhythm: Normal rate and regular rhythm.  Pulmonary:     Effort: Pulmonary effort is normal.  Abdominal:     General: Abdomen is flat.  Musculoskeletal:        General: Normal range of motion.  Skin:    General: Skin is warm.     Capillary Refill: Capillary refill takes less than 2 seconds.  Neurological:     General: No focal deficit present.     Mental Status: He is alert.  Psychiatric:        Mood and Affect: Mood normal.     BP 125/82   Pulse 83   Ht 5\' 10"  (1.778 m)   Wt 194 lb 12.8 oz (88.4 kg)   BMI 27.95 kg/m  Wt Readings from Last 3 Encounters:  12/29/20 194 lb 12.8 oz (88.4 kg)  01/24/20 190 lb (86.2 kg)  08/17/13 190 lb 4 oz (86.3 kg)    Health Maintenance Due  Topic Date Due  .  Hepatitis C Screening  Never done  . HIV Screening  Never done  . TETANUS/TDAP  Never done  . INFLUENZA VACCINE  Never done    There are no preventive care reminders to display for this patient.  CBC Latest Ref Rng & Units 12/29/2020 01/24/2020 02/26/2014  WBC 3.8 - 10.8 Thousand/uL 6.8 8.7 8.8  Hemoglobin 13.2 - 17.1 g/dL 82.8 00.3 49.1  Hematocrit 38.5 - 50.0 % 44.2 42.7 47.2  Platelets 140 - 400 Thousand/uL 180 205 186   CMP Latest Ref Rng & Units 12/29/2020 01/24/2020 02/26/2014  Glucose 65 - 99 mg/dL 791(T) 056(P) 794(I)  BUN 7 - 25 mg/dL 23 12 17   Creatinine 0.60 - 1.35 mg/dL 0.16 5.53  Sodium 135 - 146 mmol/L 140 141 139  Potassium 3.5 - 5.3 mmol/L 4.5 4.0 3.5  Chloride 98 - 110 mmol/L 106 103 106  CO2 20 - 32 mmol/L 28 25 25   Calcium 8.6 - 10.3 mg/dL 9.5 9.3 7.48)  Total Protein 6.1 - 8.1 g/dL 7.3 - 7.7  Total Bilirubin 0.2 - 1.2 mg/dL 0.5 - 0.3   Alkaline Phos Unit/L - - 73  AST 10 - 40 U/L 19 - 37  ALT 9 - 46 U/L 27 - 57    Lab Results  Component Value Date   TSH 1.12 10/25/2010   Lab Results  Component Value Date   ALBUMIN 3.8 02/26/2014   ANIONGAP 13 01/24/2020   Lab Results  Component Value Date   CHOL 254 (H) 12/29/2020   CHOL 166 11/16/2009   HDL 63 12/29/2020   HDL 55.40 11/16/2009   LDLCALC 166 (H) 12/29/2020   LDLCALC 98 11/16/2009   CHOLHDL 4.0 12/29/2020   CHOLHDL 3 11/16/2009   Lab Results  Component Value Date   TRIG 122 12/29/2020   No results found for: HGBA1C    ASSESSMENT & PLAN:   Problem List Items Addressed This Visit      Digestive   GERD (gastroesophageal reflux disease)    Takes Omeprazole as needed. Discussed daily use instead of PRN, also discussed tight fitting clothing and not eating within 2 hours of going to bed.         Other   ORGANIC IMPOTENCE    Has had difficulty with erection since using non rx Testosterone, years ago, Cialis helps achieve erection.       Relevant Orders   Testosterone (Completed)   Annual physical exam - Primary    Colon Screening- NA PSA-  TDAP- Unknown Shingles Vaccine- NA Flu Vaccine- Declined Pneumonia Vaccine- NA  Overall health good, will consider TDAP       Relevant Orders   COMPLETE METABOLIC PANEL WITH GFR (Completed)   CBC w/Diff/Platelet (Completed)   PSA (Completed)   Lipid Profile (Completed)   Need for diphtheria-tetanus-pertussis (Tdap) vaccine    Has not had TDAP within 10 years. Patient will discuss next visit      Episode of apnea    Patient has continued to have daytime fatigue with low energy. His wife tells him that he stops breathing at night often while sleeping. He was scheduled to have a sleep apnea test but cancelled the appt due to the expense but is wanting to do the test now. Plan- Will schedule Sleep study consult.        Other Visit Diagnoses    Erectile dysfunction, unspecified erectile dysfunction  type          Meds ordered this encounter  Medications  .  tadalafil (CIALIS) 20 MG tablet    Sig: Take 1 tablet (20 mg total) by mouth daily as needed for erectile dysfunction.    Dispense:  10 tablet    Refill:  5      Follow-up: No follow-ups on file.    Irish Lack, FNP [facility] [address] [place], [place] [address]

## 2020-12-29 NOTE — Assessment & Plan Note (Signed)
Has had difficulty with erection since using non rx Testosterone, years ago, Cialis helps achieve erection.

## 2020-12-29 NOTE — Assessment & Plan Note (Signed)
Takes Omeprazole as needed. Discussed daily use instead of PRN, also discussed tight fitting clothing and not eating within 2 hours of going to bed.

## 2020-12-29 NOTE — Assessment & Plan Note (Signed)
Colon Screening- NA PSA-  TDAP- Unknown Shingles Vaccine- NA Flu Vaccine- Declined Pneumonia Vaccine- NA  Overall health good, will consider TDAP

## 2020-12-29 NOTE — Assessment & Plan Note (Addendum)
Has not had TDAP within 10 years. Patient will discuss next visit

## 2020-12-29 NOTE — Assessment & Plan Note (Signed)
Patient has continued to have daytime fatigue with low energy. His wife tells him that he stops breathing at night often while sleeping. He was scheduled to have a sleep apnea test but cancelled the appt due to the expense but is wanting to do the test now. Plan- Will schedule Sleep study consult.

## 2020-12-30 LAB — COMPLETE METABOLIC PANEL WITH GFR
AG Ratio: 1.5 (calc) (ref 1.0–2.5)
ALT: 27 U/L (ref 9–46)
AST: 19 U/L (ref 10–40)
Albumin: 4.4 g/dL (ref 3.6–5.1)
Alkaline phosphatase (APISO): 53 U/L (ref 36–130)
BUN: 23 mg/dL (ref 7–25)
CO2: 28 mmol/L (ref 20–32)
Calcium: 9.5 mg/dL (ref 8.6–10.3)
Chloride: 106 mmol/L (ref 98–110)
Creat: 1.21 mg/dL (ref 0.60–1.35)
GFR, Est African American: 84 mL/min/{1.73_m2} (ref >=60)
GFR, Est Non African American: 73 mL/min/{1.73_m2} (ref >=60)
Globulin: 2.9 g/dL (calc) (ref 1.9–3.7)
Glucose, Bld: 103 mg/dL — ABNORMAL HIGH (ref 65–99)
Potassium: 4.5 mmol/L (ref 3.5–5.3)
Sodium: 140 mmol/L (ref 135–146)
Total Bilirubin: 0.5 mg/dL (ref 0.2–1.2)
Total Protein: 7.3 g/dL (ref 6.1–8.1)

## 2020-12-30 LAB — CBC WITH DIFFERENTIAL/PLATELET
Absolute Monocytes: 496 cells/uL (ref 200–950)
Basophils Absolute: 41 cells/uL (ref 0–200)
Basophils Relative: 0.6 %
Eosinophils Absolute: 190 cells/uL (ref 15–500)
Eosinophils Relative: 2.8 %
HCT: 44.2 % (ref 38.5–50.0)
Hemoglobin: 14.9 g/dL (ref 13.2–17.1)
Lymphs Abs: 1850 cells/uL (ref 850–3900)
MCH: 31 pg (ref 27.0–33.0)
MCHC: 33.7 g/dL (ref 32.0–36.0)
MCV: 91.9 fL (ref 80.0–100.0)
MPV: 12.4 fL (ref 7.5–12.5)
Monocytes Relative: 7.3 %
Neutro Abs: 4223 cells/uL (ref 1500–7800)
Neutrophils Relative %: 62.1 %
Platelets: 180 10*3/uL (ref 140–400)
RBC: 4.81 10*6/uL (ref 4.20–5.80)
RDW: 12.3 % (ref 11.0–15.0)
Total Lymphocyte: 27.2 %
WBC: 6.8 10*3/uL (ref 3.8–10.8)

## 2020-12-30 LAB — TESTOSTERONE: Testosterone: 466 ng/dL (ref 250–827)

## 2020-12-30 LAB — LIPID PANEL
Cholesterol: 254 mg/dL — ABNORMAL HIGH (ref <200)
HDL: 63 mg/dL (ref >=40)
LDL Cholesterol (Calc): 166 mg/dL (calc) — ABNORMAL HIGH
Non-HDL Cholesterol (Calc): 191 mg/dL (calc) — ABNORMAL HIGH (ref <130)
Total CHOL/HDL Ratio: 4 (calc) (ref <5.0)
Triglycerides: 122 mg/dL (ref <150)

## 2020-12-30 LAB — PSA: PSA: 0.42 ng/mL (ref <=4.0)

## 2021-01-27 ENCOUNTER — Encounter: Payer: Self-pay | Admitting: Family Medicine

## 2021-01-27 ENCOUNTER — Ambulatory Visit: Payer: BC Managed Care – PPO | Admitting: Family Medicine

## 2021-01-27 ENCOUNTER — Other Ambulatory Visit: Payer: Self-pay

## 2021-01-27 VITALS — BP 105/72 | HR 89 | Ht 70.0 in | Wt 197.8 lb

## 2021-01-27 DIAGNOSIS — E663 Overweight: Secondary | ICD-10-CM | POA: Diagnosis not present

## 2021-01-27 DIAGNOSIS — F5101 Primary insomnia: Secondary | ICD-10-CM | POA: Diagnosis not present

## 2021-01-27 DIAGNOSIS — E7800 Pure hypercholesterolemia, unspecified: Secondary | ICD-10-CM | POA: Diagnosis not present

## 2021-01-27 DIAGNOSIS — R0681 Apnea, not elsewhere classified: Secondary | ICD-10-CM | POA: Diagnosis not present

## 2021-01-27 LAB — POCT GLYCOSYLATED HEMOGLOBIN (HGB A1C): Hemoglobin A1C: 5.6 % (ref 4.0–5.6)

## 2021-01-27 MED ORDER — TRAZODONE HCL 50 MG PO TABS: 25.0000 mg | ORAL_TABLET | Freq: Every evening | ORAL | 3 refills | Status: DC | PRN

## 2021-01-27 MED ORDER — ROSUVASTATIN CALCIUM 10 MG PO TABS: 10.0000 mg | ORAL_TABLET | Freq: Every day | ORAL | 3 refills | Status: AC

## 2021-01-27 NOTE — Assessment & Plan Note (Signed)
Discussed snoring, daytime fatigue for years, his weight is at high weight, will order sleep study.

## 2021-01-27 NOTE — Assessment & Plan Note (Signed)
Weight fluctuates between 180-198, he is not doing anything for weight control at this time.  Discussed low carb and intermittent fasting.

## 2021-01-27 NOTE — Assessment & Plan Note (Signed)
LDL 163 today, will start Crestor 10 mg, his family mother and father on lipid management.

## 2021-01-27 NOTE — Assessment & Plan Note (Signed)
Patient has had insomnia for quite some time, has tried melatonin without success. Plan- Trial of Trazadone.

## 2021-01-27 NOTE — Progress Notes (Signed)
Established Patient Office Visit  SUBJECTIVE:  Subjective  Patient ID: Howard Weeks, male    DOB: 05/25/1976  Age: 45 y.o. MRN: [id number]  CC:  Chief Complaint  Patient presents with  . Lab Results    HPI Howard Weeks is a 45 y.o. male presenting today for     Past Medical History:  Diagnosis Date  . GERD (gastroesophageal reflux disease)     History reviewed. No pertinent surgical history.  History reviewed. No pertinent family history.  Social History   Socioeconomic History  . Marital status: Divorced    Spouse name: Not on file  . Number of children: 0  . Years of education: Not on file  . Highest education level: Not on file  Occupational History  . Occupation: Chemical engineer  Tobacco Use  . Smoking status: Never Smoker  . Smokeless tobacco: Never Used  Substance and Sexual Activity  . Alcohol use: Yes    Comment: socially  . Drug use: No  . Sexual activity: Not on file  Other Topics Concern  . Not on file  Social History Narrative  . Not on file   Social Determinants of Health   Financial Resource Strain: Not on file  Food Insecurity: Not on file  Transportation Needs: Not on file  Physical Activity: Not on file  Stress: Not on file  Social Connections: Not on file  Intimate Partner Violence: Not on file     Current Outpatient Medications:  .  rosuvastatin (CRESTOR) 10 MG tablet, Take 1 tablet (10 mg total) by mouth daily., Disp: 90 tablet, Rfl: 3 .  traZODone (DESYREL) 50 MG tablet, Take 0.5-1 tablets (25-50 mg total) by mouth at bedtime as needed for sleep., Disp: 30 tablet, Rfl: 3 .  esomeprazole (NEXIUM) 20 MG capsule, Take 20 mg by mouth daily before breakfast., Disp: , Rfl:  .  tadalafil (CIALIS) 20 MG tablet, Take 1 tablet (20 mg total) by mouth daily as needed for erectile dysfunction., Disp: 10 tablet, Rfl: 5   No Known Allergies  ROS Review of Systems  Constitutional: Negative.   HENT: Negative.  Negative for congestion,  drooling and trouble swallowing.   Eyes: Negative.   Respiratory: Negative.   Skin: Negative.   Neurological: Negative.   Psychiatric/Behavioral: Negative.      OBJECTIVE:    Physical Exam Constitutional:      Appearance: Normal appearance.  HENT:     Right Ear: Tympanic membrane normal.     Nose: Nose normal.     Mouth/Throat:     Mouth: Mucous membranes are moist.  Eyes:     Pupils: Pupils are equal, round, and reactive to light.  Cardiovascular:     Rate and Rhythm: Normal rate and regular rhythm.     Pulses: Normal pulses.  Musculoskeletal:        General: Normal range of motion.     Cervical back: Neck supple.  Skin:    General: Skin is warm.  Neurological:     General: No focal deficit present.     Mental Status: He is alert.  Psychiatric:        Mood and Affect: Mood normal.     BP 105/72   Pulse 89   Ht 5\' 10"  (1.778 m)   Wt 197 lb 12.8 oz (89.7 kg)   BMI 28.38 kg/m  Wt Readings from Last 3 Encounters:  01/27/21 197 lb 12.8 oz (89.7 kg)  12/29/20 194 lb 12.8 oz (88.4  kg)  01/24/20 190 lb (86.2 kg)    Health Maintenance Due  Topic Date Due  . Hepatitis C Screening  Never done  . HIV Screening  Never done  . TETANUS/TDAP  Never done  . INFLUENZA VACCINE  Never done  . COVID-19 Vaccine (3 - Booster for Pfizer series) 12/31/2020    There are no preventive care reminders to display for this patient.  CBC Latest Ref Rng & Units 12/29/2020 01/24/2020 02/26/2014  WBC 3.8 - 10.8 Thousand/uL 6.8 8.7 8.8  Hemoglobin 13.2 - 17.1 g/dL 26.7 12.4 58.0  Hematocrit 38.5 - 50.0 % 44.2 42.7 47.2  Platelets 140 - 400 Thousand/uL 180 205 186   CMP Latest Ref Rng & Units 12/29/2020 01/24/2020 02/26/2014  Glucose 65 - 99 mg/dL 998(P) 382(N) 053(Z)  BUN 7 - 25 mg/dL 23 12 17   Creatinine 0.60 - 1.35 mg/dL 7.67 3.41  Sodium 135 - 146 mmol/L 140 141 139  Potassium 3.5 - 5.3 mmol/L 4.5 4.0 3.5  Chloride 98 - 110 mmol/L 106 103 106  CO2 20 - 32 mmol/L 28 25 25   Calcium  8.6 - 10.3 mg/dL 9.5 9.3 9.37)  Total Protein 6.1 - 8.1 g/dL 7.3 - 7.7  Total Bilirubin 0.2 - 1.2 mg/dL 0.5 - 0.3  Alkaline Phos Unit/L - - 73  AST 10 - 40 U/L 19 - 37  ALT 9 - 46 U/L 27 - 57    Lab Results  Component Value Date   TSH 1.12 10/25/2010   Lab Results  Component Value Date   ALBUMIN 3.8 02/26/2014   ANIONGAP 13 01/24/2020   Lab Results  Component Value Date   CHOL 254 (H) 12/29/2020   CHOL 166 11/16/2009   HDL 63 12/29/2020   HDL 55.40 11/16/2009   LDLCALC 166 (H) 12/29/2020   LDLCALC 98 11/16/2009   CHOLHDL 4.0 12/29/2020   CHOLHDL 3 11/16/2009   Lab Results  Component Value Date   TRIG 122 12/29/2020   Lab Results  Component Value Date   HGBA1C 5.6 01/27/2021      ASSESSMENT & PLAN:   Problem List Items Addressed This Visit      Other   Apnea - Primary    Discussed snoring, daytime fatigue for years, his weight is at high weight, will order sleep study.       Overweight (BMI 25.0-29.9)    Weight fluctuates between 180-198, he is not doing anything for weight control at this time.  Discussed low carb and intermittent fasting.       Relevant Orders   POCT HgB A1C (Completed)   Pure hypercholesterolemia    LDL 163 today, will start Crestor 10 mg, his family mother and father on lipid management.       Relevant Medications   rosuvastatin (CRESTOR) 10 MG tablet   Primary insomnia    Patient has had insomnia for quite some time, has tried melatonin without success. Plan- Trial of Trazadone.          Meds ordered this encounter  Medications  . traZODone (DESYREL) 50 MG tablet    Sig: Take 0.5-1 tablets (25-50 mg total) by mouth at bedtime as needed for sleep.    Dispense:  30 tablet    Refill:  3  . rosuvastatin (CRESTOR) 10 MG tablet    Sig: Take 1 tablet (10 mg total) by mouth daily.    Dispense:  90 tablet    Refill:  3  Follow-up: No follow-ups on file.    Irish Lack, FNP [facility] [address], [place], [place] [address]

## 2021-02-18 ENCOUNTER — Other Ambulatory Visit: Payer: Self-pay | Admitting: Family Medicine

## 2021-02-24 ENCOUNTER — Other Ambulatory Visit: Payer: Self-pay

## 2021-02-24 ENCOUNTER — Encounter: Payer: Self-pay | Admitting: Family Medicine

## 2021-02-24 ENCOUNTER — Ambulatory Visit (INDEPENDENT_AMBULATORY_CARE_PROVIDER_SITE_OTHER): Payer: BC Managed Care – PPO | Admitting: Family Medicine

## 2021-02-24 VITALS — BP 121/80 | HR 87 | Ht 70.0 in | Wt 196.4 lb

## 2021-02-24 DIAGNOSIS — F5101 Primary insomnia: Secondary | ICD-10-CM

## 2021-02-24 DIAGNOSIS — E291 Testicular hypofunction: Secondary | ICD-10-CM

## 2021-02-24 DIAGNOSIS — E7800 Pure hypercholesterolemia, unspecified: Secondary | ICD-10-CM | POA: Diagnosis not present

## 2021-02-24 MED ORDER — TESTOSTERONE CYPIONATE 100 MG/ML IM SOLN: 100.0000 mg | INTRAMUSCULAR | 1 refills | Status: DC

## 2021-02-24 NOTE — Assessment & Plan Note (Signed)
Taking crestor without s/e. Will re eval lipid panel in 5 months.

## 2021-02-24 NOTE — Assessment & Plan Note (Signed)
Trazadone working well, he is sleeping through the night.

## 2021-02-24 NOTE — Progress Notes (Signed)
Established Patient Office Visit  SUBJECTIVE:  Subjective  Patient ID: Howard Weeks, male    DOB: 1976-05-23  Age: 45 y.o. MRN: [id number]  CC:  Chief Complaint  Patient presents with  . Follow-up    HPI Howard Weeks is a 45 y.o. male presenting today for     Past Medical History:  Diagnosis Date  . GERD (gastroesophageal reflux disease)     History reviewed. No pertinent surgical history.  History reviewed. No pertinent family history.  Social History   Socioeconomic History  . Marital status: Divorced    Spouse name: Not on file  . Number of children: 0  . Years of education: Not on file  . Highest education level: Not on file  Occupational History  . Occupation: Chemical engineer  Tobacco Use  . Smoking status: Never Smoker  . Smokeless tobacco: Never Used  Substance and Sexual Activity  . Alcohol use: Yes    Comment: socially  . Drug use: No  . Sexual activity: Not on file  Other Topics Concern  . Not on file  Social History Narrative  . Not on file   Social Determinants of Health   Financial Resource Strain: Not on file  Food Insecurity: Not on file  Transportation Needs: Not on file  Physical Activity: Not on file  Stress: Not on file  Social Connections: Not on file  Intimate Partner Violence: Not on file     Current Outpatient Medications:  .  testosterone cypionate (DEPO-TESTOSTERONE) 100 MG/ML injection, Inject 1 mL (100 mg total) into the muscle every 14 (fourteen) days. For IM use only, Disp: 10 mL, Rfl: 1 .  esomeprazole (NEXIUM) 20 MG capsule, Take 20 mg by mouth daily before breakfast., Disp: , Rfl:  .  rosuvastatin (CRESTOR) 10 MG tablet, Take 1 tablet (10 mg total) by mouth daily., Disp: 90 tablet, Rfl: 3 .  tadalafil (CIALIS) 20 MG tablet, Take 1 tablet (20 mg total) by mouth daily as needed for erectile dysfunction., Disp: 10 tablet, Rfl: 5 .  traZODone (DESYREL) 50 MG tablet, TAKE 0.5-1 TABLETS BY MOUTH AT BEDTIME AS NEEDED FOR  SLEEP., Disp: 90 tablet, Rfl: 2   No Known Allergies  ROS Review of Systems  Constitutional: Negative.   HENT: Negative.   Respiratory: Negative.   Genitourinary: Negative.   Neurological: Negative.   Psychiatric/Behavioral: Negative.      OBJECTIVE:    Physical Exam Vitals and nursing note reviewed.  Constitutional:      Appearance: Normal appearance.  HENT:     Head: Normocephalic.  Cardiovascular:     Rate and Rhythm: Normal rate and regular rhythm.  Musculoskeletal:        General: Normal range of motion.  Psychiatric:        Mood and Affect: Mood normal.     BP 121/80   Pulse 87   Ht 5\' 10"  (1.778 m)   Wt 196 lb 6.4 oz (89.1 kg)   BMI 28.18 kg/m  Wt Readings from Last 3 Encounters:  02/24/21 196 lb 6.4 oz (89.1 kg)  01/27/21 197 lb 12.8 oz (89.7 kg)  12/29/20 194 lb 12.8 oz (88.4 kg)    Health Maintenance Due  Topic Date Due  . Hepatitis C Screening  Never done  . HIV Screening  Never done  . TETANUS/TDAP  Never done  . INFLUENZA VACCINE  Never done  . COVID-19 Vaccine (3 - Booster for Pfizer series) 12/31/2020    There are  no preventive care reminders to display for this patient.  CBC Latest Ref Rng & Units 12/29/2020 01/24/2020 02/26/2014  WBC 3.8 - 10.8 Thousand/uL 6.8 8.7 8.8  Hemoglobin 13.2 - 17.1 g/dL 71.0 62.6 94.8  Hematocrit 38.5 - 50.0 % 44.2 42.7 47.2  Platelets 140 - 400 Thousand/uL 180 205 186   CMP Latest Ref Rng & Units 12/29/2020 01/24/2020 02/26/2014  Glucose 65 - 99 mg/dL 546(E) 703(J) 009(F)  BUN 7 - 25 mg/dL 23 12 17   Creatinine 0.60 - 1.35 mg/dL 8.18 2.99  Sodium 135 - 146 mmol/L 140 141 139  Potassium 3.5 - 5.3 mmol/L 4.5 4.0 3.5  Chloride 98 - 110 mmol/L 106 103 106  CO2 20 - 32 mmol/L 28 25 25   Calcium 8.6 - 10.3 mg/dL 9.5 9.3 3.71)  Total Protein 6.1 - 8.1 g/dL 7.3 - 7.7  Total Bilirubin 0.2 - 1.2 mg/dL 0.5 - 0.3  Alkaline Phos Unit/L - - 73  AST 10 - 40 U/L 19 - 37  ALT 9 - 46 U/L 27 - 57    Lab Results   Component Value Date   TSH 1.12 10/25/2010   Lab Results  Component Value Date   ALBUMIN 3.8 02/26/2014   ANIONGAP 13 01/24/2020   Lab Results  Component Value Date   CHOL 254 (H) 12/29/2020   CHOL 166 11/16/2009   HDL 63 12/29/2020   HDL 55.40 11/16/2009   LDLCALC 166 (H) 12/29/2020   LDLCALC 98 11/16/2009   CHOLHDL 4.0 12/29/2020   CHOLHDL 3 11/16/2009   Lab Results  Component Value Date   TRIG 122 12/29/2020   Lab Results  Component Value Date   HGBA1C 5.6 01/27/2021      ASSESSMENT & PLAN:   Problem List Items Addressed This Visit      Endocrine   Testicular hypergonadotropic hypogonadism - Primary    Patient is still having difficulty with ED. His Testosterone level was normal but with his history of Testosterone replacement in the past I feel that he may be subtherapeutic hypogonadism. Rx Depo Testosterone with 3 month fu.          Meds ordered this encounter  Medications  . testosterone cypionate (DEPO-TESTOSTERONE) 100 MG/ML injection    Sig: Inject 1 mL (100 mg total) into the muscle every 14 (fourteen) days. For IM use only    Dispense:  10 mL    Refill:  1      Follow-up: No follow-ups on file.    02/26/2021, FNP [facility] [address] [place], [address] [place]

## 2021-02-24 NOTE — Assessment & Plan Note (Signed)
Patient is still having difficulty with ED. His Testosterone level was normal but with his history of Testosterone replacement in the past I feel that he may be subtherapeutic hypogonadism. Rx Depo Testosterone with 3 month fu.

## 2021-04-30 IMAGING — CR DG CHEST 2V
1 series · 2 of 2 positions shown · non-contrast
Comparison: None.

CLINICAL DATA: Shortness of breath

EXAM:
CHEST - 2 VIEW

[Series 1: dg chest 2 view · 0.14mm/px · 2 of 2 slices shown]
[im 1/2]
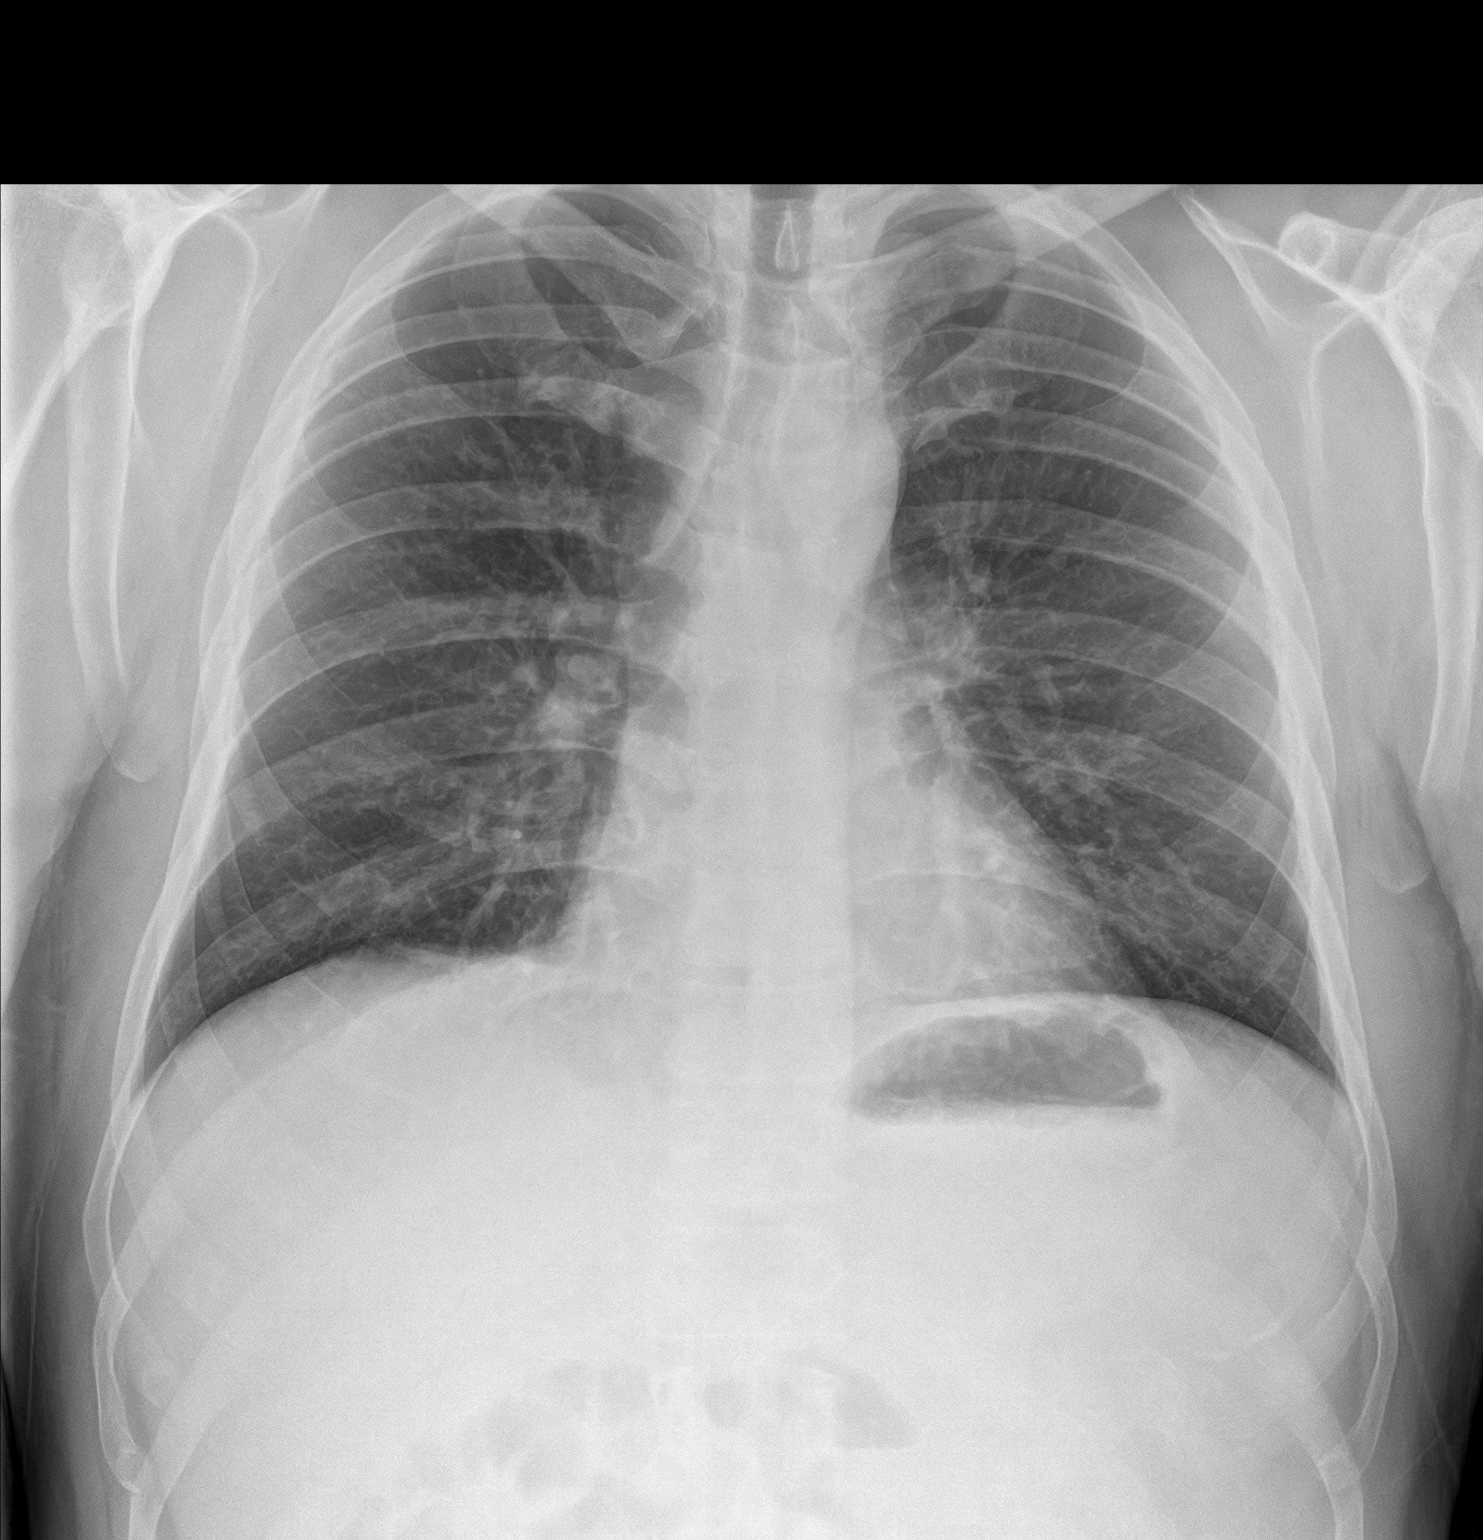
[im 2/2]
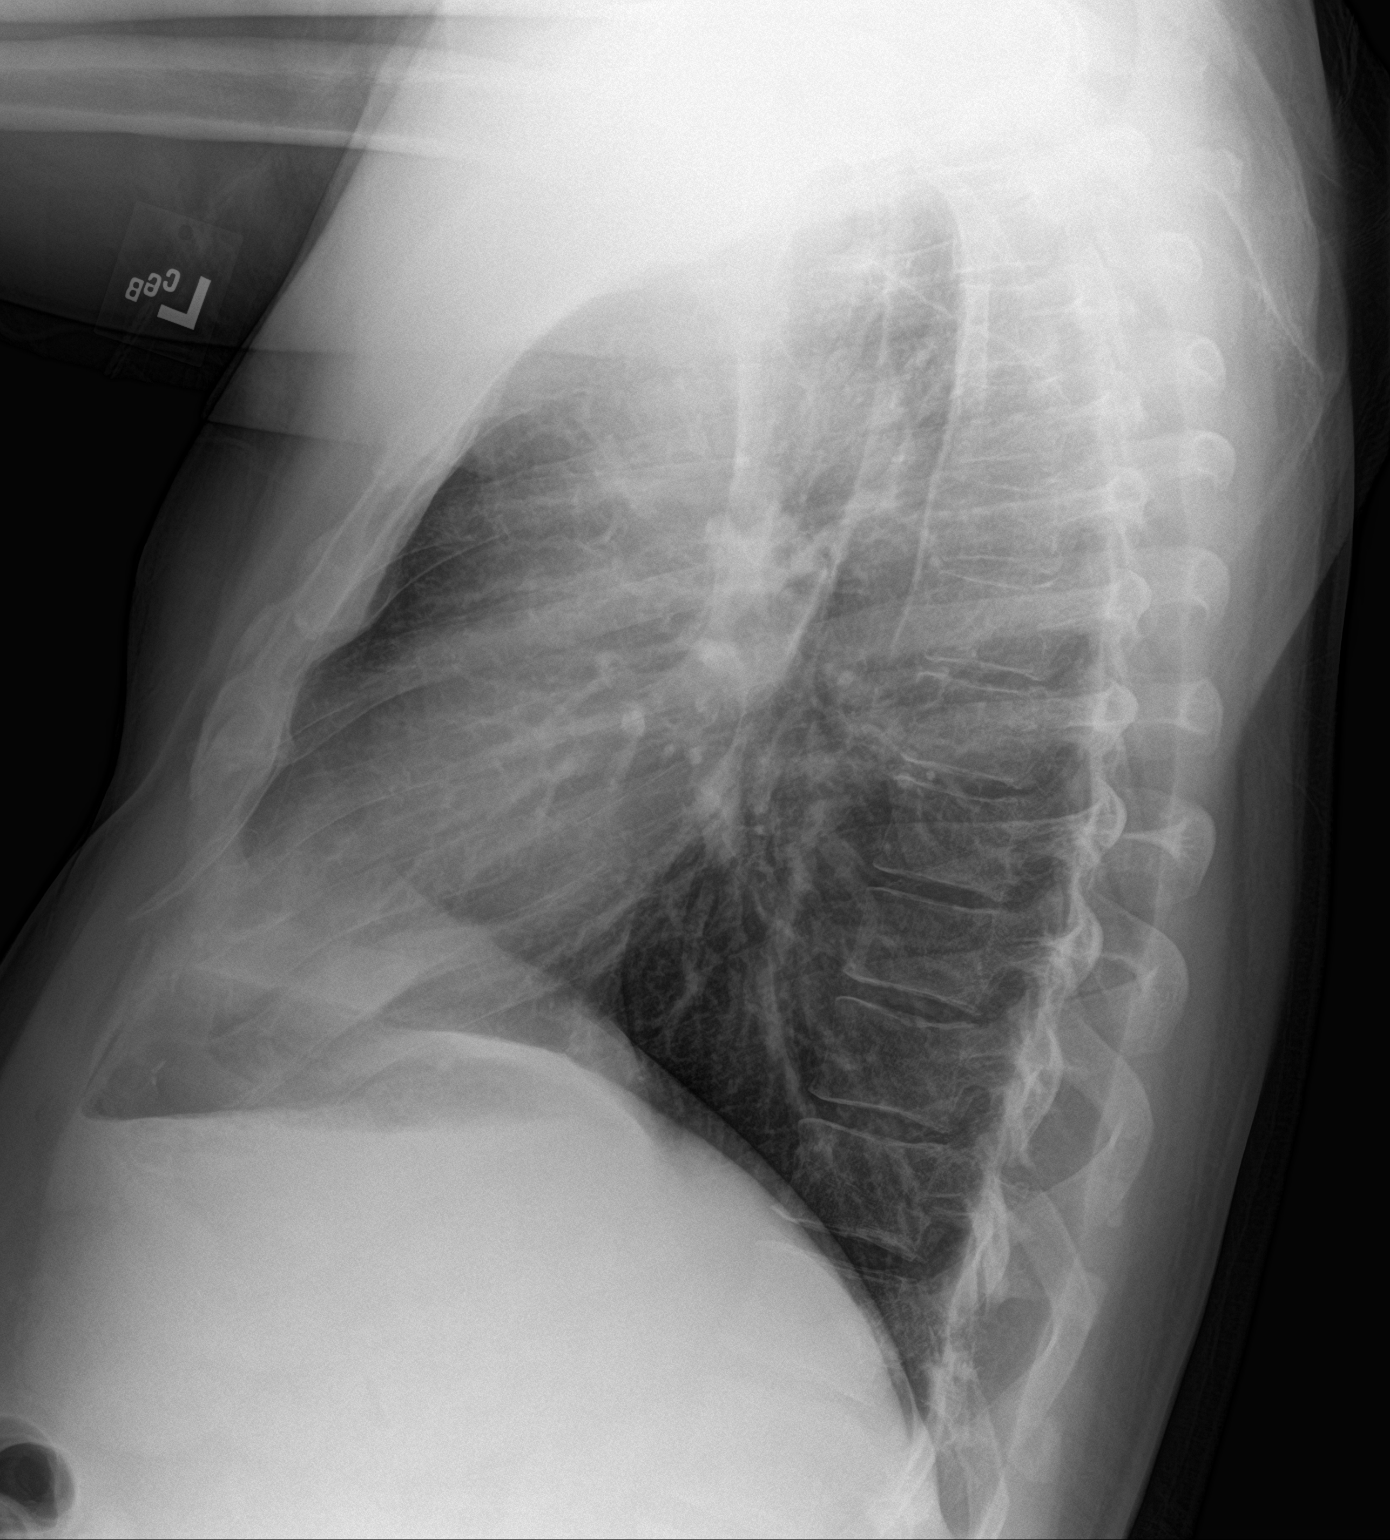

[2 of 2 positions shown; findings below may reference images not displayed]

FINDINGS: The heart size and mediastinal contours are within normal limits.
Both lungs are clear. The visualized skeletal structures are
unremarkable.
IMPRESSION: No active cardiopulmonary disease.

## 2021-07-06 ENCOUNTER — Ambulatory Visit: Payer: BC Managed Care – PPO | Admitting: Family Medicine

## 2021-07-13 ENCOUNTER — Encounter: Payer: Self-pay | Admitting: Family Medicine

## 2021-07-13 ENCOUNTER — Ambulatory Visit (INDEPENDENT_AMBULATORY_CARE_PROVIDER_SITE_OTHER): Payer: BC Managed Care – PPO | Admitting: Family Medicine

## 2021-07-13 ENCOUNTER — Other Ambulatory Visit: Payer: Self-pay

## 2021-07-13 VITALS — BP 135/88 | HR 84 | Ht 70.0 in | Wt 192.0 lb

## 2021-07-13 DIAGNOSIS — E291 Testicular hypofunction: Secondary | ICD-10-CM | POA: Diagnosis not present

## 2021-07-13 DIAGNOSIS — E7800 Pure hypercholesterolemia, unspecified: Secondary | ICD-10-CM

## 2021-07-13 NOTE — Progress Notes (Signed)
Established Patient Office Visit  SUBJECTIVE:  Subjective  Patient ID: Howard Weeks, male    DOB: [birth year]  Age: 45 y.o. MRN: [id number]  CC:  Chief Complaint  Patient presents with   Follow-up    Patient is here for 3 month follow up for recheck on testosterone and cholesterol.     HPI Howard Weeks is a 45 y.o. male presenting today for     Past Medical History:  Diagnosis Date   GERD (gastroesophageal reflux disease)     History reviewed. No pertinent surgical history.  History reviewed. No pertinent family history.  Social History   Socioeconomic History   Marital status: Divorced    Spouse name: Not on file   Number of children: 0   Years of education: Not on file   Highest education level: Not on file  Occupational History   Occupation: Chemical engineer  Tobacco Use   Smoking status: Never   Smokeless tobacco: Never  Substance and Sexual Activity   Alcohol use: Yes    Comment: socially   Drug use: No   Sexual activity: Not on file  Other Topics Concern   Not on file  Social History Narrative   Not on file   Social Determinants of Health   Financial Resource Strain: Not on file  Food Insecurity: Not on file  Transportation Needs: Not on file  Physical Activity: Not on file  Stress: Not on file  Social Connections: Not on file  Intimate Partner Violence: Not on file     Current Outpatient Medications:    esomeprazole (NEXIUM) 20 MG capsule, Take 20 mg by mouth daily before breakfast., Disp: , Rfl:    rosuvastatin (CRESTOR) 10 MG tablet, Take 1 tablet (10 mg total) by mouth daily., Disp: 90 tablet, Rfl: 3   tadalafil (CIALIS) 20 MG tablet, Take 1 tablet (20 mg total) by mouth daily as needed for erectile dysfunction., Disp: 10 tablet, Rfl: 5   testosterone cypionate (DEPO-TESTOSTERONE) 100 MG/ML injection, Inject 1 mL (100 mg total) into the muscle every 14 (fourteen) days. For IM use only, Disp: 10 mL, Rfl: 1   traZODone (DESYREL) 50 MG tablet,  TAKE 0.5-1 TABLETS BY MOUTH AT BEDTIME AS NEEDED FOR SLEEP., Disp: 90 tablet, Rfl: 2   No Known Allergies  ROS Review of Systems  Constitutional: Negative.   HENT: Negative.    Respiratory: Negative.    Cardiovascular: Negative.   Genitourinary: Negative.   Musculoskeletal: Negative.   Skin: Negative.   Psychiatric/Behavioral: Negative.      OBJECTIVE:    Physical Exam Constitutional:      Appearance: Normal appearance.  HENT:     Head: Normocephalic and atraumatic.  Cardiovascular:     Rate and Rhythm: Normal rate and regular rhythm.  Musculoskeletal:        General: Normal range of motion.  Skin:    General: Skin is warm.  Neurological:     Mental Status: He is alert.  Psychiatric:        Mood and Affect: Mood normal.    BP 135/88   Pulse 84   Ht 5\' 10"  (1.778 m)   Wt 192 lb (87.1 kg)   BMI 27.55 kg/m  Wt Readings from Last 3 Encounters:  07/13/21 192 lb (87.1 kg)  02/24/21 196 lb 6.4 oz (89.1 kg)  01/27/21 197 lb 12.8 oz (89.7 kg)    Health Maintenance Due  Topic Date Due   HIV Screening  Never done  Hepatitis C Screening  Never done   TETANUS/TDAP  Never done   COVID-19 Vaccine (3 - Booster for Pfizer series) 11/30/2020    There are no preventive care reminders to display for this patient.  CBC Latest Ref Rng & Units 12/29/2020 01/24/2020 02/26/2014  WBC 3.8 - 10.8 Thousand/uL 6.8 8.7 8.8  Hemoglobin 13.2 - 17.1 g/dL 60.4 54.0 98.1  Hematocrit 38.5 - 50.0 % 44.2 42.7 47.2  Platelets 140 - 400 Thousand/uL 180 205 186   CMP Latest Ref Rng & Units 12/29/2020 01/24/2020 02/26/2014  Glucose 65 - 99 mg/dL 191(Y) 782(N) 562(Z)  BUN 7 - 25 mg/dL 23 12 17   Creatinine 0.60 - 1.35 mg/dL 3.08 6.57  Sodium 135 - 146 mmol/L 140 141 139  Potassium 3.5 - 5.3 mmol/L 4.5 4.0 3.5  Chloride 98 - 110 mmol/L 106 103 106  CO2 20 - 32 mmol/L 28 25 25   Calcium 8.6 - 10.3 mg/dL 9.5 9.3 8.46)  Total Protein 6.1 - 8.1 g/dL 7.3 - 7.7  Total Bilirubin 0.2 - 1.2 mg/dL 0.5 -  0.3  Alkaline Phos Unit/L - - 73  AST 10 - 40 U/L 19 - 37  ALT 9 - 46 U/L 27 - 57    Lab Results  Component Value Date   TSH 1.12 10/25/2010   Lab Results  Component Value Date   ALBUMIN 3.8 02/26/2014   ANIONGAP 13 01/24/2020   Lab Results  Component Value Date   CHOL 254 (H) 12/29/2020   CHOL 166 11/16/2009   HDL 63 12/29/2020   HDL 55.40 11/16/2009   LDLCALC 166 (H) 12/29/2020   LDLCALC 98 11/16/2009   CHOLHDL 4.0 12/29/2020   CHOLHDL 3 11/16/2009   Lab Results  Component Value Date   TRIG 122 12/29/2020   Lab Results  Component Value Date   HGBA1C 5.6 01/27/2021      ASSESSMENT & PLAN:   Problem List Items Addressed This Visit       Endocrine   Testicular hypergonadotropic hypogonadism - Primary    Sleeping well and doing injection every 2 weeks as rx, reports improved injury.          Other   Pure hypercholesterolemia    Patient has been on Statin for 6 months, fu today for lipid panel, following diet well and taking all meds.         No orders of the defined types were placed in this encounter.     Follow-up: No follow-ups on file.    02/26/2021, FNP [facility] [address], [place], [address] [place]

## 2021-07-13 NOTE — Assessment & Plan Note (Signed)
Sleeping well and doing injection every 2 weeks as rx, reports improved injury.

## 2021-07-13 NOTE — Assessment & Plan Note (Signed)
Patient has been on Statin for 6 months, fu today for lipid panel, following diet well and taking all meds.

## 2021-07-14 LAB — LIPID PANEL
Cholesterol: 172 mg/dL (ref <200)
HDL: 70 mg/dL (ref >=40)
LDL Cholesterol (Calc): 81 mg/dL (calc)
Non-HDL Cholesterol (Calc): 102 mg/dL (calc) (ref <130)
Total CHOL/HDL Ratio: 2.5 (calc) (ref <5.0)
Triglycerides: 117 mg/dL (ref <150)

## 2022-01-10 ENCOUNTER — Ambulatory Visit: Payer: BC Managed Care – PPO | Admitting: Internal Medicine

## 2022-01-10 ENCOUNTER — Other Ambulatory Visit: Payer: Self-pay

## 2022-01-10 ENCOUNTER — Encounter: Payer: Self-pay | Admitting: Internal Medicine

## 2022-01-10 VITALS — BP 111/68 | HR 103 | Ht 70.0 in | Wt 195.8 lb

## 2022-01-10 DIAGNOSIS — J309 Allergic rhinitis, unspecified: Secondary | ICD-10-CM

## 2022-01-10 DIAGNOSIS — M549 Dorsalgia, unspecified: Secondary | ICD-10-CM | POA: Diagnosis not present

## 2022-01-10 DIAGNOSIS — K219 Gastro-esophageal reflux disease without esophagitis: Secondary | ICD-10-CM

## 2022-01-10 DIAGNOSIS — E663 Overweight: Secondary | ICD-10-CM

## 2022-01-10 DIAGNOSIS — Z1159 Encounter for screening for other viral diseases: Secondary | ICD-10-CM

## 2022-01-10 DIAGNOSIS — E291 Testicular hypofunction: Secondary | ICD-10-CM

## 2022-01-10 DIAGNOSIS — E7800 Pure hypercholesterolemia, unspecified: Secondary | ICD-10-CM

## 2022-01-10 DIAGNOSIS — Z114 Encounter for screening for human immunodeficiency virus [HIV]: Secondary | ICD-10-CM

## 2022-01-10 DIAGNOSIS — Z0000 Encounter for general adult medical examination without abnormal findings: Secondary | ICD-10-CM

## 2022-01-10 MED ORDER — TESTOSTERONE CYPIONATE 100 MG/ML IM SOLN: 100.0000 mg | INTRAMUSCULAR | 1 refills | Status: AC

## 2022-01-10 NOTE — Assessment & Plan Note (Signed)
Take Claritin 5 mg p.o. daily 

## 2022-01-10 NOTE — Assessment & Plan Note (Signed)

## 2022-01-10 NOTE — Assessment & Plan Note (Signed)
Hypercholesterolemia  I advised the patient to follow Mediterranean diet This diet is rich in fruits vegetables and whole grain, and This diet is also rich in fish and lean meat Patient should also eat a handful of almonds or walnuts daily Recent heart study indicated that average follow-up on this kind of diet reduces the cardiovascular mortality by 50 to 70%== 

## 2022-01-10 NOTE — Progress Notes (Signed)
Established Patient Office Visit  Subjective:  Patient ID: Howard Weeks, male    DOB: October 05, 1976  Age: 46 y.o. MRN: [id number]  CC:  Chief Complaint  Patient presents with   Follow-up    HPI  ADMA GAERTNER presents for general checkup patient denies any chest pain or shortness of breath.  Past Medical History:  Diagnosis Date   GERD (gastroesophageal reflux disease)     History reviewed. No pertinent surgical history.  History reviewed. No pertinent family history.  Social History   Socioeconomic History   Marital status: Divorced    Spouse name: Not on file   Number of children: 0   Years of education: Not on file   Highest education level: Not on file  Occupational History   Occupation: Chemical engineer  Tobacco Use   Smoking status: Never   Smokeless tobacco: Never  Substance and Sexual Activity   Alcohol use: Yes    Comment: socially   Drug use: No   Sexual activity: Not on file  Other Topics Concern   Not on file  Social History Narrative   Not on file   Social Determinants of Health   Financial Resource Strain: Not on file  Food Insecurity: Not on file  Transportation Needs: Not on file  Physical Activity: Not on file  Stress: Not on file  Social Connections: Not on file  Intimate Partner Violence: Not on file     Current Outpatient Medications:    esomeprazole (NEXIUM) 20 MG capsule, Take 20 mg by mouth daily before breakfast., Disp: , Rfl:    rosuvastatin (CRESTOR) 10 MG tablet, Take 1 tablet (10 mg total) by mouth daily., Disp: 90 tablet, Rfl: 3   tadalafil (CIALIS) 20 MG tablet, Take 1 tablet (20 mg total) by mouth daily as needed for erectile dysfunction., Disp: 10 tablet, Rfl: 5   testosterone cypionate (DEPO-TESTOSTERONE) 100 MG/ML injection, Inject 1 mL (100 mg total) into the muscle every 14 (fourteen) days. For IM use only, Disp: 10 mL, Rfl: 1   traZODone (DESYREL) 50 MG tablet, TAKE 0.5-1 TABLETS BY MOUTH AT BEDTIME AS NEEDED FOR SLEEP.,  Disp: 90 tablet, Rfl: 2   No Known Allergies  ROS Review of Systems  Constitutional: Negative.   HENT: Negative.    Eyes: Negative.   Respiratory: Negative.    Cardiovascular: Negative.   Gastrointestinal: Negative.   Endocrine: Negative.   Genitourinary: Negative.   Musculoskeletal: Negative.   Skin: Negative.   Allergic/Immunologic: Negative.   Neurological: Negative.   Hematological: Negative.   Psychiatric/Behavioral: Negative.    All other systems reviewed and are negative.    Objective:    Physical Exam Vitals reviewed.  Constitutional:      Appearance: Normal appearance.  HENT:     Mouth/Throat:     Mouth: Mucous membranes are moist.  Eyes:     Pupils: Pupils are equal, round, and reactive to light.  Neck:     Vascular: No carotid bruit.  Cardiovascular:     Rate and Rhythm: Normal rate and regular rhythm.     Pulses: Normal pulses.     Heart sounds: Normal heart sounds.  Pulmonary:     Effort: Pulmonary effort is normal.     Breath sounds: Normal breath sounds.  Abdominal:     General: Bowel sounds are normal.     Palpations: Abdomen is soft. There is no hepatomegaly, splenomegaly or mass.     Tenderness: There is no abdominal tenderness.  Hernia: No hernia is present.  Musculoskeletal:     Cervical back: Neck supple.     Right lower leg: No edema.     Left lower leg: No edema.  Skin:    Findings: No rash.  Neurological:     Mental Status: He is alert and oriented to person, place, and time.     Motor: No weakness.  Psychiatric:        Mood and Affect: Mood normal.        Behavior: Behavior normal.    BP 111/68    Pulse (!) 103    Ht 5\' 10"  (1.778 m)    Wt 195 lb 12.8 oz (88.8 kg)    BMI 28.09 kg/m  Wt Readings from Last 3 Encounters:  01/10/22 195 lb 12.8 oz (88.8 kg)  07/13/21 192 lb (87.1 kg)  02/24/21 196 lb 6.4 oz (89.1 kg)     Health Maintenance Due  Topic Date Due   HIV Screening  Never done   Hepatitis C Screening  Never  done   TETANUS/TDAP  Never done    There are no preventive care reminders to display for this patient.  Lab Results  Component Value Date   TSH 1.12 10/25/2010   Lab Results  Component Value Date   WBC 6.8 12/29/2020   HGB 14.9 12/29/2020   HCT 44.2 12/29/2020   MCV 91.9 12/29/2020   PLT 180 12/29/2020   Lab Results  Component Value Date   NA 140 12/29/2020   K 4.5 12/29/2020   CO2 28 12/29/2020   GLUCOSE 103 (H) 12/29/2020   BUN 23 12/29/2020   CREATININE 1.21 12/29/2020   BILITOT 0.5 12/29/2020   ALKPHOS 73 02/26/2014   AST 19 12/29/2020   ALT 27 12/29/2020   PROT 7.3 12/29/2020   ALBUMIN 3.8 02/26/2014   CALCIUM 9.5 12/29/2020   ANIONGAP 13 01/24/2020   Lab Results  Component Value Date   CHOL 172 07/13/2021   Lab Results  Component Value Date   HDL 70 07/13/2021   Lab Results  Component Value Date   LDLCALC 81 07/13/2021   Lab Results  Component Value Date   TRIG 117 07/13/2021   Lab Results  Component Value Date   CHOLHDL 2.5 07/13/2021   Lab Results  Component Value Date   HGBA1C 5.6 01/27/2021      Assessment & Plan:   Problem List Items Addressed This Visit       Respiratory   Allergic rhinitis - Primary    Take Claritin 5 mg p.o. daily        Digestive   GERD (gastroesophageal reflux disease)    Counseling  If a person has gastroesophageal reflux disease (GERD), food and stomach acid move back up into the esophagus and cause symptoms or problems such as damage to the esophagus.  Anti-reflux measures include: raising the head of the bed, avoiding tight clothing or belts, avoiding eating late at night, not lying down shortly after mealtime, and achieving weight loss.  Avoid ASA, NSAID's, caffeine, alcohol, and tobacco.   OTC Pepcid and/or Tums are often very helpful for as needed use.   However, for persisting chronic or daily symptoms, stronger medications like Omeprazole may be needed.  You may need to avoid foods and  drinks such as: ? Coffee and tea (with or without caffeine). ? Drinks that contain alcohol. ? Energy drinks and sports drinks. ? Bubbly (carbonated) drinks or sodas. ? Chocolate and cocoa. ? Peppermint  and mint flavorings. ? Garlic and onions. ? Horseradish. ? Spicy and acidic foods. These include peppers, chili powder, curry powder, vinegar, hot sauces, and BBQ sauce. ? Citrus fruit juices and citrus fruits, such as oranges, lemons, and limes. ? Tomato-based foods. These include red sauce, chili, salsa, and pizza with red sauce. ? Fried and fatty foods. These include donuts, french fries, potato chips, and high-fat dressings. ? High-fat meats. These include hot dogs, rib eye steak, sausage, ham, and bacon.         Endocrine   Testicular hypergonadotropic hypogonadism     Other   Upper back pain on left side    - Patient's back pain is under control with medication.  - Encouraged the patient to stretch or do yoga as able to help with back pain      Overweight (BMI 25.0-29.9)    - I encouraged the patient to lose weight.  - I educated them on making healthy dietary choices including eating more fruits and vegetables and less fried foods. - I encouraged the patient to exercise more, and educated on the benefits of exercise including weight loss, diabetes prevention, and hypertension prevention.   Dietary counseling with a registered dietician  Referral to a weight management support group (e.g. Weight Watchers, Overeaters Anonymous)  If your BMI is greater than 29 or you have gained more than 15 pounds you should work on weight loss.  Attend a healthy cooking class       Pure hypercholesterolemia    Hypercholesterolemia  I advised the patient to follow Mediterranean diet This diet is rich in fruits vegetables and whole grain, and This diet is also rich in fish and lean meat Patient should also eat a handful of almonds or walnuts daily Recent heart study indicated that  average follow-up on this kind of diet reduces the cardiovascular mortality by 50 to 70%==       No orders of the defined types were placed in this encounter.   Follow-up: No follow-ups on file.    Cletis Athens, MD

## 2022-01-10 NOTE — Assessment & Plan Note (Signed)

## 2022-01-10 NOTE — Assessment & Plan Note (Signed)
-   Patient's back pain is under control with medication.  - Encouraged the patient to stretch or do yoga as able to help with back pain 

## 2022-01-11 LAB — HEPATITIS C ANTIBODY
Hepatitis C Ab: NONREACTIVE
SIGNAL TO CUT-OFF: 0.09 (ref <1.00)

## 2022-01-11 LAB — LIPID PANEL
Cholesterol: 158 mg/dL (ref <200)
HDL: 61 mg/dL (ref >=40)
LDL Cholesterol (Calc): 80 mg/dL (calc)
Non-HDL Cholesterol (Calc): 97 mg/dL (calc) (ref <130)
Total CHOL/HDL Ratio: 2.6 (calc) (ref <5.0)
Triglycerides: 87 mg/dL (ref <150)

## 2022-01-11 LAB — CBC WITH DIFFERENTIAL/PLATELET
Absolute Monocytes: 590 cells/uL (ref 200–950)
Basophils Absolute: 90 cells/uL (ref 0–200)
Basophils Relative: 1.1 %
Eosinophils Absolute: 148 cells/uL (ref 15–500)
Eosinophils Relative: 1.8 %
HCT: 45.5 % (ref 38.5–50.0)
Hemoglobin: 15 g/dL (ref 13.2–17.1)
Lymphs Abs: 2173 cells/uL (ref 850–3900)
MCH: 29.9 pg (ref 27.0–33.0)
MCHC: 33 g/dL (ref 32.0–36.0)
MCV: 90.8 fL (ref 80.0–100.0)
MPV: 12.3 fL (ref 7.5–12.5)
Monocytes Relative: 7.2 %
Neutro Abs: 5199 cells/uL (ref 1500–7800)
Neutrophils Relative %: 63.4 %
Platelets: 317 10*3/uL (ref 140–400)
RBC: 5.01 10*6/uL (ref 4.20–5.80)
RDW: 12.2 % (ref 11.0–15.0)
Total Lymphocyte: 26.5 %
WBC: 8.2 10*3/uL (ref 3.8–10.8)

## 2022-01-11 LAB — COMPLETE METABOLIC PANEL WITH GFR
AG Ratio: 1.6 (calc) (ref 1.0–2.5)
ALT: 38 U/L (ref 9–46)
AST: 24 U/L (ref 10–40)
Albumin: 4.6 g/dL (ref 3.6–5.1)
Alkaline phosphatase (APISO): 65 U/L (ref 36–130)
BUN/Creatinine Ratio: 11 (calc) (ref 6–22)
BUN: 17 mg/dL (ref 7–25)
CO2: 27 mmol/L (ref 20–32)
Calcium: 9.7 mg/dL (ref 8.6–10.3)
Chloride: 103 mmol/L (ref 98–110)
Creat: 1.49 mg/dL — ABNORMAL HIGH (ref 0.60–1.29)
Globulin: 2.9 g/dL (calc) (ref 1.9–3.7)
Glucose, Bld: 102 mg/dL — ABNORMAL HIGH (ref 65–99)
Potassium: 4.9 mmol/L (ref 3.5–5.3)
Sodium: 140 mmol/L (ref 135–146)
Total Bilirubin: 0.4 mg/dL (ref 0.2–1.2)
Total Protein: 7.5 g/dL (ref 6.1–8.1)
eGFR: 59 mL/min/{1.73_m2} — ABNORMAL LOW (ref >=60)

## 2022-01-11 LAB — PSA: PSA: 0.42 ng/mL (ref <=4.00)

## 2022-01-11 LAB — TSH: TSH: 1.09 mIU/L (ref 0.40–4.50)

## 2022-01-11 LAB — HIV ANTIBODY (ROUTINE TESTING W REFLEX): HIV 1&2 Ab, 4th Generation: NONREACTIVE

## 2022-01-16 LAB — TESTOSTERONE,FREE AND TOTAL
Testosterone, Free: 11.9 pg/mL (ref 6.8–21.5)
Testosterone: 385 ng/dL (ref 264–916)

## 2022-01-31 ENCOUNTER — Ambulatory Visit: Payer: BC Managed Care – PPO | Admitting: Internal Medicine

## 2022-01-31 ENCOUNTER — Other Ambulatory Visit: Payer: Self-pay

## 2022-03-08 ENCOUNTER — Other Ambulatory Visit
Admission: RE | Admit: 2022-03-08 | Discharge: 2022-03-08 | Disposition: A | Discharge status: Home / Self Care | Payer: BC Managed Care – PPO | Source: Ambulatory Visit | Attending: Student | Admitting: Student

## 2022-03-08 DIAGNOSIS — R0789 Other chest pain: Secondary | ICD-10-CM | POA: Insufficient documentation

## 2022-03-08 LAB — D-DIMER, QUANTITATIVE: D-Dimer, Quant: 0.38 ug/mL-FEU (ref 0.00–0.50)

## 2023-10-22 ENCOUNTER — Ambulatory Visit: Payer: BC Managed Care – PPO | Admitting: Urology

## 2023-10-22 VITALS — BP 105/72 | HR 76 | Ht 70.0 in | Wt 195.0 lb

## 2023-10-22 DIAGNOSIS — Z3009 Encounter for other general counseling and advice on contraception: Secondary | ICD-10-CM

## 2023-10-22 DIAGNOSIS — Q554 Other congenital malformations of vas deferens, epididymis, seminal vesicles and prostate: Secondary | ICD-10-CM

## 2023-10-22 MED ORDER — DIAZEPAM 10 MG PO TABS: 10.0000 mg | ORAL_TABLET | Freq: Once | ORAL | 0 refills | Status: AC

## 2023-10-22 NOTE — Patient Instructions (Signed)
Pre-Vasectomy Instructions ? ?STOP all aspirin or blood thinners (Aspirin, Plavix, Coumadin, Warfarin, Motrin, Ibuprofen, Advil, Aleve, Naproxen, Naprosyn) for 7 days prior to the procedure.  If you have any questions about stopping these medications please contact your primary care physician or cardiologist. ? ?Shave all hair from the upper scrotum on the day of the procedure.  This means just under the penis onto the scrotal sac.  The area shaved should measure about 2-3 inches around.  You may lather the scrotum with soap and water, and shave with a safety razor. ? ?After shaving the area, thoroughly wash the penis and the scrotum, then shower or bathe to remove all the loose hairs.  If needed, wash the area again just before coming in for your Vasectomy. ? ?It is recommended to have a light meal an hour or so prior to the procedure. ? ?Bring a scrotal support (jock strap or suspensory, or tight jockey shorts or underwear).  Wear comfortable pants or shorts. ? ?While the actual procedure usually takes about 45 minutes, you should be prepared to stay in the office for approximately one hour.  Bring someone with you to drive you home. ? ?If you have any questions or concerns, please feel free to call the office at [phone]. ?  ?

## 2023-10-22 NOTE — Progress Notes (Signed)
I,Amy L Pierron,acting as a scribe for Vanna Scotland, MD.,have documented all relevant documentation on the behalf of Vanna Scotland, MD,as directed by  Vanna Scotland, MD while in the presence of Vanna Scotland, MD.  10/22/2023 9:48 AM   Howard Weeks [id number] [id number]  Referring provider: Jerl Mina, MD [address] [facility] [place],  [place] [address]  Chief Complaint  Patient presents with   VAS Consult    HPI: 47 y.o. year old male referred for further evaluation of possible vasectomy.  He denies a history of testicular trauma or pain.  No urinary issues.  No previous scrotal surgeries.  He and his wife have decided they don't want any children together.    PMH: Past Medical History:  Diagnosis Date   GERD (gastroesophageal reflux disease)     Home Medications:  Allergies as of 10/22/2023   No Known Allergies      Medication List        Accurate as of October 22, 2023  9:48 AM. If you have any questions, ask your nurse or doctor.          esomeprazole 20 MG capsule Commonly known as: NEXIUM Take 20 mg by mouth daily before breakfast.   rosuvastatin 10 MG tablet Commonly known as: Crestor Take 1 tablet (10 mg total) by mouth daily.   tadalafil 20 MG tablet Commonly known as: Cialis Take 1 tablet (20 mg total) by mouth daily as needed for erectile dysfunction.   testosterone cypionate 100 MG/ML injection Commonly known as: Depo-Testosterone Inject 1 mL (100 mg total) into the muscle every 14 (fourteen) days. For IM use only   traZODone 50 MG tablet Commonly known as: DESYREL TAKE 0.5-1 TABLETS BY MOUTH AT BEDTIME AS NEEDED FOR SLEEP.       Social History:  reports that he has never smoked. He has never used smokeless tobacco. He reports current alcohol use. He reports that he does not use drugs.   Physical Exam: BP 105/72   Pulse 76   Ht 5\' 10"  (1.778 m)   Wt 195 lb (88.5 kg)   BMI 27.98 kg/m   Constitutional:   Alert and oriented, No acute distress. HEENT: [place] AT, moist mucus membranes.  Trachea midline, no masses. Cardiovascular: No clubbing, cyanosis, or edema. Respiratory: Normal respiratory effort, no increased work of breathing. GI: Abdomen is soft, nontender, nondistended, no abdominal masses GU: Normal phallus.  Bilateral descended testicles without masses.  Let vasa easily palpable. Right side not able to palpate. Skin: No rashes, bruises or suspicious lesions. Neurologic: Grossly intact, no focal deficits, moving all 4 extremities. Psychiatric: Normal mood and affect.   Assessment & Plan:    1. Vasectomy evaluation  - Today, we discussed what the vas deferens is, where it is located, and its function. We reviewed the procedure for vasectomy, it's risks, benefits, alternatives, and likelihood of achieving his goals. We discussed in detail the procedure, complications, and recovery as well as the need for clearance prior to unprotected intercourse. We discussed that vasectomy does not protect against sexually transmitted diseases. We discussed that this procedure does not result in immediate sterility and that they would need to use other forms of birth control until he has been cleared with negative postvasectomy semen analyses. I explained that the procedure is considered to be permanent and that attempts at reversal have varying degrees of success. These options include vasectomy reversal, sperm retrieval, and in vitro fertilization; these can be very expensive.  We discussed the chance of postvasectomy pain syndrome which occurs in less than 5% of patients. I explained to the patient that there is no treatment to resolve this chronic pain, and that if it developed I would not be able to help resolve the issue, but that surgery is generally not needed for correction. I explained there have even been reports of systemic like illness associated with this chronic pain, and that there was no good cure. I  explained that vasectomy it is not a 100% reliable form of birth control, and the risk of pregnancy after vasectomy is approximately 1 in 2000 men who had a negative postvasectomy semen analysis or rare non-motile sperm. I explained that repeat vasectomy was necessary in less than 1% of vasectomy procedures when employing the type of technique that I use. I explained that he should refrain from ejaculation for approximately one week following vasectomy. I explained that there are other options for birth control which are permanent and non-permanent; we discussed these. I explained the rates of surgical complications, such as symptomatic hematoma or infection, are low (1-2%) and vary with the surgeon's experience and criteria used to diagnose the complication.   - The patient had the opportunity to ask questions to his stated satisfaction. He voiced understanding of the above factors and stated that he has read all the information provided to him and the packets and informed consent.  - He is interested in receiving of Valium 10 mg prior to the procedure for the purpose of anxiolysis.  A prescription was given today.  He will have a driver on the day of the procedure. He is interested in getting it done on a Friday.   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  This may be positional versus a trimix versus congenitally absent.  Would be helpful to have a renal ultrasound to assess whether or not he is right kidney as if the right kidney is missing, is likely that he does in fact have congenital absence.  - Plan for renal ultrasound to evaluate prior to vasectomy (no  previous imaging)    [facility] [address] [place], [place] [address] [phone]

## 2023-11-07 ENCOUNTER — Ambulatory Visit: Admission: RE | Admit: 2023-11-07 | Payer: BC Managed Care – PPO | Source: Ambulatory Visit

## 2023-11-19 ENCOUNTER — Other Ambulatory Visit: Payer: Self-pay | Admitting: Family Medicine

## 2023-11-19 DIAGNOSIS — R0602 Shortness of breath: Secondary | ICD-10-CM

## 2023-11-19 DIAGNOSIS — R0789 Other chest pain: Secondary | ICD-10-CM

## 2023-11-26 ENCOUNTER — Ambulatory Visit
Admission: RE | Admit: 2023-11-26 | Discharge: 2023-11-26 | Disposition: A | Discharge status: Home / Self Care | Payer: BC Managed Care – PPO | Source: Ambulatory Visit | Attending: Family Medicine | Admitting: Family Medicine

## 2023-11-26 DIAGNOSIS — R0789 Other chest pain: Secondary | ICD-10-CM | POA: Insufficient documentation

## 2023-11-26 DIAGNOSIS — R0602 Shortness of breath: Secondary | ICD-10-CM | POA: Insufficient documentation

## 2023-11-29 ENCOUNTER — Encounter: Payer: BC Managed Care – PPO | Admitting: Urology

## 2024-03-02 ENCOUNTER — Telehealth: Payer: Self-pay | Admitting: Urology

## 2024-03-02 NOTE — Telephone Encounter (Signed)
 Patient advised that [place] is needed prior and will set up vasectomy based on the results from the imaging

## 2024-03-02 NOTE — Telephone Encounter (Signed)
 Patient called today and would like to reschedule his vasectomy that he cancelled back in December. He said that it was discussed at his vas consult appt, that he should have an ultrasound prior to having vasectomy. He has not had this done, and would like to know if this is still needed before he schedules vasectomy. He still has [employer], and has not received a new card. He said there are no changes to insurance. Please advise patient.

## 2024-03-06 ENCOUNTER — Ambulatory Visit
Admission: RE | Admit: 2024-03-06 | Discharge: 2024-03-06 | Disposition: A | Discharge status: Home / Self Care | Source: Ambulatory Visit | Attending: Urology | Admitting: Urology

## 2024-03-06 DIAGNOSIS — Q554 Other congenital malformations of vas deferens, epididymis, seminal vesicles and prostate: Secondary | ICD-10-CM | POA: Diagnosis present

## 2024-04-03 ENCOUNTER — Telehealth: Payer: Self-pay | Admitting: Urology

## 2024-04-03 NOTE — Telephone Encounter (Signed)
 Patient called requesting a call back regarding his [place] results, as he is waiting to have procedure scheduled. Please advise patient.

## 2024-04-07 NOTE — Telephone Encounter (Signed)
 OK to schedule vas.  He has 2 kidneys that appear normal.  Dustin Gimenez, MD

## 2024-04-08 NOTE — Telephone Encounter (Signed)
 Left message for patient to call back and schedule appointment.

## 2024-04-08 NOTE — Telephone Encounter (Signed)
 Patient called back and scheduled for 05/20/24

## 2024-05-20 ENCOUNTER — Ambulatory Visit (INDEPENDENT_AMBULATORY_CARE_PROVIDER_SITE_OTHER): Admitting: Urology

## 2024-05-20 VITALS — BP 122/70 | HR 70

## 2024-05-20 DIAGNOSIS — Z9852 Vasectomy status: Secondary | ICD-10-CM

## 2024-05-20 NOTE — Patient Instructions (Signed)

## 2024-05-20 NOTE — Progress Notes (Signed)
 05/20/24  CC:  Chief Complaint  Patient presents with   VAS    HPI: 48 year old male who presents today for vasectomy.  On his initial exam, the right vas was not palpable concerning for possible congenital absence.  He did have a follow-up renal ultrasound that showed 2 normal kidneys.  Today on exam I was able to palpate both and was able to achieve a bilateral vasectomy.  Blood pressure 122/70, pulse 70. NED. A&Ox3.   No respiratory distress   Abd soft, NT, ND Normal external genitalia with patent urethral meatus  A timeout was performed.  Patient's identity and consent was confirmed.  All questions were answered.   Bilateral Vasectomy Procedure  Pre-Procedure: - Patient's scrotum was prepped and draped for vasectomy. - The vas was palpated through the scrotal skin on the left. - 1% Xylocaine was injected into the skin and surrounding tissue for placement  - In a similar manner, the vas on the right was identified, anesthetized, and stabilized.  Procedure: - A #11 blade was used to make a small stab incision in the skin overlying the vas - The left vas was isolated and brought up through the incision exposing that structure. - Bleeding points were cauterized as they occurred. - The vas was free from the surrounding structures and brought to the view. - A segment was positioned for placement with a hemostat. - A second hemostat was placed and a small segment between the two hemostats and was removed for inspection. - Each end of the transected vas lumen was fulgurated/ obliterated using needlepoint electrocautery -A fascial interposition was performed on testicular end of the vas using #3-0 chromic suture -The same procedure was performed on the right. - A single suture of #3-0 chromic catgut was used to close each lateral scrotal skin incision - A dressing was applied.  Post-Procedure: - Patient was instructed in care of the operative area - A specimen is to be  delivered in 12 weeks   -Another form of contraception is to be used until post vasectomy semen analysis  Dustin Gimenez, MD

## 2024-08-26 ENCOUNTER — Other Ambulatory Visit

## 2024-08-26 DIAGNOSIS — Z9852 Vasectomy status: Secondary | ICD-10-CM

## 2024-08-27 ENCOUNTER — Other Ambulatory Visit

## 2024-08-27 LAB — POST-VAS SPERM EVALUATION,QUAL: Volume: 1.9 mL
